# Patient Record
Sex: Female | Born: 1975 | Race: White | Hispanic: No | Marital: Single | Smoking: Never smoker
Health system: Southern US, Community
[De-identification: ages and names within clinical notes are randomized; demographics above are authoritative.]

## PROBLEM LIST (undated history)

## (undated) DIAGNOSIS — J329 Chronic sinusitis, unspecified: Secondary | ICD-10-CM

## (undated) DIAGNOSIS — J45909 Unspecified asthma, uncomplicated: Secondary | ICD-10-CM

## (undated) DIAGNOSIS — F32A Depression, unspecified: Secondary | ICD-10-CM

## (undated) DIAGNOSIS — F419 Anxiety disorder, unspecified: Secondary | ICD-10-CM

## (undated) DIAGNOSIS — J40 Bronchitis, not specified as acute or chronic: Secondary | ICD-10-CM

## (undated) DIAGNOSIS — G4700 Insomnia, unspecified: Secondary | ICD-10-CM

## (undated) DIAGNOSIS — K219 Gastro-esophageal reflux disease without esophagitis: Secondary | ICD-10-CM

## (undated) DIAGNOSIS — Z205 Contact with and (suspected) exposure to viral hepatitis: Secondary | ICD-10-CM

## (undated) DIAGNOSIS — F329 Major depressive disorder, single episode, unspecified: Secondary | ICD-10-CM

## (undated) HISTORY — DX: Depression, unspecified: F32.A

## (undated) HISTORY — DX: Bronchitis, not specified as acute or chronic: J40

## (undated) HISTORY — DX: Gastro-esophageal reflux disease without esophagitis: K21.9

## (undated) HISTORY — PX: KNEE ARTHROSCOPY: SUR90

## (undated) HISTORY — DX: Contact with and (suspected) exposure to viral hepatitis: Z20.5

## (undated) HISTORY — DX: Insomnia, unspecified: G47.00

## (undated) HISTORY — DX: Anxiety disorder, unspecified: F41.9

## (undated) HISTORY — DX: Major depressive disorder, single episode, unspecified: F32.9

## (undated) HISTORY — DX: Chronic sinusitis, unspecified: J32.9

## (undated) HISTORY — DX: Unspecified asthma, uncomplicated: J45.909

## (undated) HISTORY — PX: CARPAL TUNNEL RELEASE: SHX101

---
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
## 1995-07-12 HISTORY — PX: CHOLECYSTECTOMY: SHX55

## 2010-10-02 ENCOUNTER — Emergency Department (HOSPITAL_COMMUNITY): Payer: Medicaid Other

## 2010-10-02 ENCOUNTER — Emergency Department (HOSPITAL_COMMUNITY)
Admission: EM | Admit: 2010-10-02 | Discharge: 2010-10-02 | Disposition: A | Discharge status: Home / Self Care | Payer: Medicaid Other | Attending: Emergency Medicine | Admitting: Emergency Medicine

## 2010-10-02 DIAGNOSIS — Y92410 Unspecified street and highway as the place of occurrence of the external cause: Secondary | ICD-10-CM | POA: Insufficient documentation

## 2010-10-02 DIAGNOSIS — IMO0002 Reserved for concepts with insufficient information to code with codable children: Secondary | ICD-10-CM | POA: Insufficient documentation

## 2010-10-02 DIAGNOSIS — S82839A Other fracture of upper and lower end of unspecified fibula, initial encounter for closed fracture: Secondary | ICD-10-CM | POA: Insufficient documentation

## 2010-12-16 ENCOUNTER — Encounter: Payer: Self-pay | Admitting: Physician Assistant

## 2010-12-29 ENCOUNTER — Emergency Department (HOSPITAL_COMMUNITY): Payer: Medicaid Other

## 2010-12-29 ENCOUNTER — Emergency Department (HOSPITAL_COMMUNITY)
Admission: EM | Admit: 2010-12-29 | Discharge: 2010-12-29 | Disposition: A | Discharge status: Home / Self Care | Payer: Medicaid Other | Attending: Emergency Medicine | Admitting: Emergency Medicine

## 2010-12-29 DIAGNOSIS — X58XXXA Exposure to other specified factors, initial encounter: Secondary | ICD-10-CM | POA: Insufficient documentation

## 2010-12-29 DIAGNOSIS — IMO0002 Reserved for concepts with insufficient information to code with codable children: Secondary | ICD-10-CM | POA: Insufficient documentation

## 2010-12-29 LAB — D-DIMER, QUANTITATIVE: D-Dimer, Quant: 0.26 ug/mL-FEU (ref 0.00–0.48)

## 2013-10-08 ENCOUNTER — Ambulatory Visit (INDEPENDENT_AMBULATORY_CARE_PROVIDER_SITE_OTHER): Payer: Medicaid Other | Admitting: General Practice

## 2013-10-08 ENCOUNTER — Encounter (INDEPENDENT_AMBULATORY_CARE_PROVIDER_SITE_OTHER): Payer: Self-pay

## 2013-10-08 ENCOUNTER — Encounter: Payer: Self-pay | Admitting: General Practice

## 2013-10-08 VITALS — BP 129/77 | HR 59 | Temp 98.2°F | Ht 68.25 in | Wt 253.8 lb

## 2013-10-08 DIAGNOSIS — M25569 Pain in unspecified knee: Secondary | ICD-10-CM

## 2013-10-08 DIAGNOSIS — M25561 Pain in right knee: Secondary | ICD-10-CM

## 2013-10-08 NOTE — Progress Notes (Signed)
   Subjective:    Patient ID: Savannah Ortiz, female    DOB: [birth year], 38 y.o.   MRN: [id number]  HPI Patient presents today with complaints of right knee pain and referral request. She was seen at  [facility] and diagnosed with torn ligaments.     Review of Systems  Constitutional: Negative for fever and chills.  Respiratory: Negative for chest tightness and shortness of breath.   Cardiovascular: Negative for chest pain and palpitations.  Musculoskeletal:       Right knee pain        Objective:   Physical Exam  Constitutional: She is oriented to person, place, and time. She appears well-developed and well-nourished.  Cardiovascular: Normal rate, regular rhythm and normal heart sounds.   Pulmonary/Chest: Effort normal and breath sounds normal. No respiratory distress. She exhibits no tenderness.  Musculoskeletal: She exhibits tenderness.  Right knee pain and tenderness with palpation, flexion  And extension.   Neurological: She is alert and oriented to person, place, and time.  Skin: Skin is warm and dry.          Assessment & Plan:  1. Right knee pain - Ambulatory referral to Orthopedic Surgery -continue home care instructions -RTO prn Patient verbalized understanding Savannah KeensMae E. Efrata Brunner, FNP-C

## 2013-10-08 NOTE — Patient Instructions (Signed)
Knee Pain Knee pain can be a result of an injury or other medical conditions. Treatment will depend on the cause of your pain. HOME CARE  Only take medicine as told by your doctor.  Keep a healthy weight. Being overweight can make the knee hurt more.  Stretch before exercising or playing sports.  If there is constant knee pain, change the way you exercise. Ask your doctor for advice.  Make sure shoes fit well. Choose the right shoe for the sport or activity.  Protect your knees. Wear kneepads if needed.  Rest when you are tired. GET HELP RIGHT AWAY IF:   Your knee pain does not stop.  Your knee pain does not get better.  Your knee joint feels hot to the touch.  You have a fever. MAKE SURE YOU:   Understand these instructions.  Will watch this condition.  Will get help right away if you are not doing well or get worse. Document Released: 09/23/2008 Document Revised: 09/19/2011 Document Reviewed: 09/23/2008 ExitCare Patient Information 2014 [employer].  

## 2013-11-04 ENCOUNTER — Encounter: Payer: Self-pay | Admitting: General Practice

## 2013-11-04 ENCOUNTER — Ambulatory Visit (INDEPENDENT_AMBULATORY_CARE_PROVIDER_SITE_OTHER): Payer: Medicaid Other | Admitting: General Practice

## 2013-11-04 VITALS — BP 122/69 | HR 55 | Temp 97.1°F | Ht 68.25 in | Wt 260.8 lb

## 2013-11-04 DIAGNOSIS — Z205 Contact with and (suspected) exposure to viral hepatitis: Secondary | ICD-10-CM

## 2013-11-04 DIAGNOSIS — F411 Generalized anxiety disorder: Secondary | ICD-10-CM

## 2013-11-04 DIAGNOSIS — E663 Overweight: Secondary | ICD-10-CM

## 2013-11-04 DIAGNOSIS — Z20828 Contact with and (suspected) exposure to other viral communicable diseases: Secondary | ICD-10-CM

## 2013-11-04 NOTE — Patient Instructions (Signed)
Calorie Counting Diet A calorie counting diet requires you to eat the number of calories that are right for you in a day. Calories are the measurement of how much energy you get from the food you eat. Eating the right amount of calories is important for staying at a healthy weight. If you eat too many calories, your body will store them as fat and you may gain weight. If you eat too few calories, you may lose weight. Counting the number of calories you eat during a day will help you know if you are eating the right amount. A Registered Dietitian can determine how many calories you need in a day. The amount of calories needed varies from person to person. If your goal is to lose weight, you will need to eat fewer calories. Losing weight can benefit you if you are overweight or have health problems such as heart disease, high blood pressure, or diabetes. If your goal is to gain weight, you will need to eat more calories. Gaining weight may be necessary if you have a certain health problem that causes your body to need more energy. TIPS Whether you are increasing or decreasing the number of calories you eat during a day, it may be hard to get used to changes in what you eat and drink. The following are tips to help you keep track of the number of calories you eat.  Measure foods at home with measuring cups. This helps you know the amount of food and number of calories you are eating.  Restaurants often serve food in amounts that are larger than 1 serving. While eating out, estimate how many servings of a food you are given. For example, a serving of cooked rice is  cup or about the size of half of a fist. Knowing serving sizes will help you be aware of how much food you are eating at restaurants.  Ask for smaller portion sizes or child-size portions at restaurants.  Plan to eat half of a meal at a restaurant. Take the rest home or share the other half with a friend.  Read the Nutrition Facts panel on  food labels for calorie content and serving size. You can find out how many servings are in a package, the size of a serving, and the number of calories each serving has.  For example, a package might contain 3 cookies. The Nutrition Facts panel on that package says that 1 serving is 1 cookie. Below that, it will say there are 3 servings in the container. The calories section of the Nutrition Facts label says there are 90 calories. This means there are 90 calories in 1 cookie (1 serving). If you eat 1 cookie you have eaten 90 calories. If you eat all 3 cookies, you have eaten 270 calories (3 servings x 90 calories = 270 calories). The list below tells you how big or small some common portion sizes are.  1 oz.........4 stacked dice.  3 oz.........Deck of cards.  1 tsp........Tip of little finger.  1 tbs........Thumb.  2 tbs........Golf ball.   cup.......Half of a fist.  1 cup........A fist. KEEP A FOOD LOG Write down every food item you eat, the amount you eat, and the number of calories in each food you eat during the day. At the end of the day, you can add up the total number of calories you have eaten. It may help to keep a list like the one below. Find out the calorie information by reading the   Nutrition Facts panel on food labels. Breakfast  Bran cereal (1 cup, 110 calories).  Fat-free milk ( cup, 45 calories). Snack  Apple (1 medium, 80 calories). Lunch  Spinach (1 cup, 20 calories).  Tomato ( medium, 20 calories).  Chicken breast strips (3 oz, 165 calories).  Shredded cheddar cheese ( cup, 110 calories).  Light Italian dressing (2 tbs, 60 calories).  Whole-wheat bread (1 slice, 80 calories).  Tub margarine (1 tsp, 35 calories).  Vegetable soup (1 cup, 160 calories). Dinner  Pork chop (3 oz, 190 calories).  Brown rice (1 cup, 215 calories).  Steamed broccoli ( cup, 20 calories).  Strawberries (1  cup, 65 calories).  Whipped cream (1 tbs, 50  calories). Daily Calorie Total: 1425 Document Released: 06/27/2005 Document Revised: 09/19/2011 Document Reviewed: 12/22/2006 ExitCare Patient Information 2014 [employer].  

## 2013-11-04 NOTE — Progress Notes (Signed)
   Subjective:    Patient ID: Savannah Ortiz, female    DOB: 11/13/1975, 38 y.o.   MRN: [id number]  HPI Patient presents today to discuss weight loss, anxiety medications, and hep c testing. She reports being exposed to hep c by her husband. Patient also reports a history of taking ativan 0.5mg , 3 times daily for anxiety, but hasn't been on medication for 2-3 years. Wants to start a appetite suppressant for weight loss. Also reports being scheduled for right knee surgery in the near future.     Review of Systems  Constitutional: Negative for fever and chills.  Respiratory: Negative for chest tightness and shortness of breath.   Cardiovascular: Negative for chest pain and palpitations.  Musculoskeletal:       Right knee pain  Neurological: Negative for dizziness, weakness and headaches.  Psychiatric/Behavioral: Negative for suicidal ideas, sleep disturbance and self-injury. The patient is nervous/anxious.        Objective:   Physical Exam  Constitutional: She is oriented to person, place, and time. She appears well-developed and well-nourished.  Pulmonary/Chest: Effort normal and breath sounds normal. No respiratory distress. She exhibits no tenderness.  Musculoskeletal:  Limited range of motion right knee  Neurological: She is alert and oriented to person, place, and time.  Skin: Skin is warm and dry.  Psychiatric: She has a normal mood and affect.          Assessment & Plan:  1. Exposure to hepatitis C  - Hepatitis C antibody  2. Generalized anxiety disorder -discussed relaxation techniques -discussed other medications for anxiety (lexapro, paxil, prozac) -discussed counseling to help determine source of anxiety -patient declined suggested treatments for anxiety  3. Overweight -discussed making appointment with clinical pharmacist for healthy eating plan -discussed calorie counting Patient verbalized understanding Coralie KeensMae E. Taegen Delker, FNP-C

## 2013-11-05 LAB — HEPATITIS C ANTIBODY: Hep C Virus Ab: <0.1 s/co ratio (ref 0.0–0.9)

## 2013-11-08 ENCOUNTER — Telehealth: Payer: Self-pay | Admitting: General Practice

## 2013-11-08 NOTE — Telephone Encounter (Signed)
Attempted to contact patient, without succes and inform of lab results.

## 2013-11-21 ENCOUNTER — Telehealth: Payer: Self-pay | Admitting: General Practice

## 2013-11-21 NOTE — Telephone Encounter (Signed)
Please review labs. 

## 2013-11-21 NOTE — Telephone Encounter (Signed)
Hepatitis c panel negative

## 2013-11-22 NOTE — Telephone Encounter (Signed)
Discussed results She is going to schedule a follow up appt to discuss GERD and have additional labs.

## 2013-11-26 ENCOUNTER — Telehealth: Payer: Self-pay | Admitting: *Deleted

## 2013-11-26 ENCOUNTER — Ambulatory Visit: Payer: Medicaid Other

## 2013-11-26 NOTE — Telephone Encounter (Signed)
Normal labwork left on vm.

## 2014-03-18 ENCOUNTER — Telehealth: Payer: Self-pay | Admitting: Family Medicine

## 2014-03-27 NOTE — Telephone Encounter (Signed)
Unable to reach after multiple attempts 

## 2014-04-29 ENCOUNTER — Telehealth: Payer: Self-pay | Admitting: General Practice

## 2014-04-29 NOTE — Telephone Encounter (Signed)
Pt not had meds filled by us. Pt will ask [facility] to send request to us and will route to providers for review.

## 2014-04-29 NOTE — Telephone Encounter (Signed)
This needs a prior authorization

## 2014-04-30 NOTE — Telephone Encounter (Signed)
permethin if not for flea bites it is for scabies We have never seen patient for protonix

## 2014-05-07 ENCOUNTER — Encounter (INDEPENDENT_AMBULATORY_CARE_PROVIDER_SITE_OTHER): Payer: Self-pay

## 2014-05-07 ENCOUNTER — Telehealth: Payer: Self-pay | Admitting: Family Medicine

## 2014-05-07 ENCOUNTER — Ambulatory Visit (INDEPENDENT_AMBULATORY_CARE_PROVIDER_SITE_OTHER): Payer: Medicaid Other | Admitting: Family Medicine

## 2014-05-07 VITALS — BP 140/90 | HR 61 | Temp 97.9°F | Ht 68.25 in | Wt 256.6 lb

## 2014-05-07 DIAGNOSIS — R202 Paresthesia of skin: Secondary | ICD-10-CM

## 2014-05-07 DIAGNOSIS — M25561 Pain in right knee: Secondary | ICD-10-CM

## 2014-05-07 MED ORDER — IBUPROFEN 800 MG PO TABS: 800.0000 mg | ORAL_TABLET | Freq: Three times a day (TID) | ORAL | Status: DC | PRN

## 2014-05-07 MED ORDER — PANTOPRAZOLE SODIUM 40 MG PO TBEC: 40.0000 mg | DELAYED_RELEASE_TABLET | Freq: Every day | ORAL | Status: DC

## 2014-05-07 NOTE — Progress Notes (Signed)
   Subjective:    Patient ID: Savannah Ortiz, female    DOB: 01/15/1976, 38 y.o.   MRN: [id number]  HPI [age] year old female here to follow up with knee pain. She had surgery in June but prior to that had some injections for meniscal tear, by history. She thinks the orthopedic doctor calls some damage and now she has paresthesias and shooting pains emanating from her knee. She was previously on blood pressure medicine and says that she wants that refilled but as I explained to her her pressure today is upper limits of normal and I would not restart medicine based on that one measurement. I have asked her to check her blood pressure at home and return in 1 month with blood pressure readings.    Review of Systems  Constitutional: Negative.   HENT: Negative.   Cardiovascular: Negative.   Gastrointestinal: Negative.   Musculoskeletal: Positive for arthralgias.       R knee pain  Psychiatric/Behavioral: Negative.        Objective:   Physical Exam  Constitutional:  obese  Cardiovascular: Normal rate.   Pulmonary/Chest: Effort normal and breath sounds normal.  Musculoskeletal:  Would not permit adequate exam due to pain  Neurological: She is alert.  Reflexes not checked as tapping with reflex hammer produced pain   BP 140/90  Pulse 61  Temp(Src) 97.9 F (36.6 C) (Oral)  Ht 5' 8.25" (1.734 m)  Wt 256 lb 9.6 oz (116.393 kg)  BMI 38.71 kg/m2       Assessment & Plan:  1. Knee joint pain, right Referred to neuro because other ortho referral would be not likely to deal with another ortho's problem - Ambulatory referral to Neurology  2. Paresthesia of right leg Doubt that injections or surgery really is causative agent - Ambulatory referral to Neurology  Savannah KusterStephen M Nalany Steedley MD

## 2014-05-07 NOTE — Telephone Encounter (Signed)
Lm, Protonix and ibuprofen at pharmacy although patient said she was told they were not.    No ativan was ordered by provider. Patient is requesting ativan refill. Please advise.

## 2014-05-08 ENCOUNTER — Telehealth: Payer: Self-pay | Admitting: Family Medicine

## 2014-05-08 ENCOUNTER — Other Ambulatory Visit: Payer: Self-pay | Admitting: Family Medicine

## 2014-05-08 MED ORDER — LORAZEPAM 0.5 MG PO TABS: 0.5000 mg | ORAL_TABLET | Freq: Two times a day (BID) | ORAL | Status: DC | PRN

## 2014-05-08 NOTE — Telephone Encounter (Signed)
Med was called in

## 2014-05-08 NOTE — Telephone Encounter (Signed)
Nurse says rx was called in to [facility] in [place].

## 2014-06-23 ENCOUNTER — Ambulatory Visit (INDEPENDENT_AMBULATORY_CARE_PROVIDER_SITE_OTHER): Payer: Medicaid Other | Admitting: Neurology

## 2014-06-23 ENCOUNTER — Encounter: Payer: Self-pay | Admitting: Neurology

## 2014-06-23 VITALS — BP 118/70 | HR 53 | Ht 69.0 in | Wt 257.3 lb

## 2014-06-23 DIAGNOSIS — R202 Paresthesia of skin: Secondary | ICD-10-CM

## 2014-06-23 DIAGNOSIS — M79604 Pain in right leg: Secondary | ICD-10-CM

## 2014-06-23 DIAGNOSIS — M545 Low back pain: Secondary | ICD-10-CM

## 2014-06-23 NOTE — Patient Instructions (Addendum)
1.  MRI lumbar spine wo contrast 2.  Consider EMG going forward, pending MRI results 3.  Return to clinic in 6 weeks

## 2014-06-23 NOTE — Progress Notes (Signed)
Savannah Ortiz - Initial Visit   Date: 06/23/2014  Savannah Ortiz MRN: [id number] DOB: 25-Sep-1975   Dear Dr. Hyacinth MeekerMiller:  Thank you for your kind referral of Savannah Ortiz for consultation of right knee pain and paresthesias. Although her history is well known to you, please allow us to reiterate it for the purpose of our medical record. The patient was accompanied to the clinic by self.   History of Present Illness: Savannah Ortiz is a 38 y.o. right-handed Caucasian female with depression, anxiety, GERD, and hepatitis C exposure (antibody negative) presenting for evaluation of right knee pain and paresthesias.  Patient underwent right knee surgery for meniscal tear in June 2015.  In October, she received an injection ("orthovist") to her right knee because of persistent pain and swelling.  After her second injection, the following day she developed soreness of her knee and tingling of the toes with radiation into her back.  She reports having generalized right leg pain involving a circumferential pattern.  She feels that it is positional, but has not noticed which position makes it worse.  Even at rest, she has dull pain of her legs.  No true weakness or falls, but has a limp because of her knee pain.   She reports having problems with swallowing for the past 2-3 months, especially with solids.  She is unable to swallow pills well.  Denies dysarthria, double vision, or limb weakness.    Out-side paper records, electronic medical record, and images have been reviewed where available and summarized as:  XR right tibia/fibula 10/01/2013:  No abnormalities  Past Medical History  Diagnosis Date  . Insomnia   . Anxiety   . Allergic rhinitis   . Reactive airway disease   . Depression   . Bronchitis   . Sinusitis   . Hepatitis C     Exposure (Husband is positive)    Past Surgical History  Procedure Laterality Date  . Reconstructive for  feet and leg  1985  . Cholecystectomy  1997  . Carpal tunnel release  2010 & 2011     Medications:  Current Outpatient Prescriptions on File Prior to Visit  Medication Sig Dispense Refill  . ibuprofen (ADVIL,MOTRIN) 800 MG tablet Take 1 tablet (800 mg total) by mouth every 8 (eight) hours as needed. 90 tablet 1  . LORazepam (ATIVAN) 0.5 MG tablet Take 1 tablet (0.5 mg total) by mouth 2 (two) times daily as needed. 60 tablet 0  . pantoprazole (PROTONIX) 40 MG tablet Take 1 tablet (40 mg total) by mouth daily. 30 tablet 1   No current facility-administered medications on file prior to visit.    Allergies:  Allergies  Allergen Reactions  . Codeine     Family History: Family History  Problem Relation Age of Onset  . Diabetes Maternal Grandmother   . Ovarian cancer Mother   . Thyroid cancer Father   . Cancer Sister   . Cancer Maternal Grandmother   . Cancer Paternal Grandfather   . Healthy Daughter   . Healthy Son     x 3    Social History: History   Social History  . Marital Status: Legally Separated    Spouse Name: N/A    Number of Children: N/A  . Years of Education: N/A   Occupational History  . Not on file.   Social History Main Topics  . Smoking status: Never Smoker   . Smokeless tobacco: Not on file  .  Alcohol Use: No  . Drug Use: No  . Sexual Activity: Not on file   Other Topics Concern  . Not on file   Social History Narrative   Lives with husband and 3 sons in a one story home.     Works in a Programme researcher, broadcasting/film/video.     Education level: some college.      Review of Systems:  CONSTITUTIONAL: No fevers, chills, night sweats, or weight loss.   EYES: No visual changes or eye pain ENT: No hearing changes.  No history of nose bleeds.   RESPIRATORY: No cough, wheezing and shortness of breath.   CARDIOVASCULAR: Negative for chest pain, and palpitations.   GI: Negative for abdominal discomfort, blood in stools or black stools.  No recent change in bowel  habits.   GU:  No history of incontinence.   MUSCLOSKELETAL: +history of joint pain or swelling.  No myalgias.   SKIN: Negative for lesions, rash, and itching.   HEMATOLOGY/ONCOLOGY: Negative for prolonged bleeding, bruising easily, and swollen nodes.  No history of cancer.   ENDOCRINE: Negative for cold or heat intolerance, polydipsia or goiter.   PSYCH:  +depression or anxiety symptoms.   NEURO: As Above.   Vital Signs:  BP 118/70 mmHg  Pulse 53  Ht 5\' 9"  (1.753 m)  Wt 257 lb 5 oz (116.716 kg)  BMI 37.98 kg/m2  SpO2 99%  General Medical Exam:   General:  Obese, comfortable.   Eyes/ENT: see cranial nerve examination.   Neck: No masses appreciated.  Full range of motion without tenderness.  No carotid bruits. Respiratory:  Clear to auscultation, good air entry bilaterally.   Cardiac:  Regular rate and rhythm, no murmur.   Extremities:  No deformities, edema, or skin discoloration.  Skin:  No rashes or lesions.  Neurological Exam: MENTAL STATUS including orientation to time, place, person, recent and remote memory, attention span and concentration, language, and fund of knowledge is normal.  Speech is not dysarthric.  Lingual and gutteral sounds are normal.  CRANIAL NERVES: II:  No visual field defects.  Unremarkable fundi.   III-IV-VI: Pupils equal round and reactive to light.  Normal conjugate, extra-ocular eye movements in all directions of gaze.  No nystagmus.  No ptosis.   V:  Normal facial sensation.  VII:  Normal facial symmetry and movements. Facial muscles are full and intact  VIII:  Normal hearing and vestibular function.   IX-X:  Normal palatal movement.  Poor dentition. XI:  Normal shoulder shrug and head rotation.   XII:  Normal tongue strength and range of motion, no deviation or fasciculation.  MOTOR:  No atrophy, fasciculations or abnormal movements.  No pronator drift.  Tone is normal.    Right Upper Extremity:    Left Upper Extremity:    Deltoid  5/5    Deltoid  5/5   Biceps  5/5   Biceps  5/5   Triceps  5/5   Triceps  5/5   Wrist extensors  5/5   Wrist extensors  5/5   Wrist flexors  5/5   Wrist flexors  5/5   Finger extensors  5/5   Finger extensors  5/5   Finger flexors  5/5   Finger flexors  5/5   Dorsal interossei  5/5   Dorsal interossei  5/5   Abductor pollicis  5/5   Abductor pollicis  5/5   Tone (Ashworth scale)  0  Tone (Ashworth scale)  0   Right Lower Extremity:  Left Lower Extremity:    Hip flexors* inconsistent  5/5   Hip flexors  5/5   Hip extensors  5/5   Hip extensors  5/5   Knee flexors  5/5   Knee flexors  5/5   Knee extensors  5/5   Knee extensors  5/5   Dorsiflexors  5/5   Dorsiflexors  5/5   Plantarflexors  5/5   Plantarflexors  5/5   Toe extensors  5/5   Toe extensors  5/5   Toe flexors  5/5   Toe flexors  5/5   Tone (Ashworth scale)  0  Tone (Ashworth scale)  0   MSRs:  Right                                                                 Left brachioradialis 2+  brachioradialis 2+  biceps 2+  biceps 2+  triceps 2+  triceps 2+  patellar 2+  patellar 2+  ankle jerk 1+  ankle jerk 1+  Hoffman no  Hoffman no  plantar response down  plantar response down   SENSORY:  Reduced pin prick, temperature, and vibration over the right leg (circumfrentially)  Romberg's sign absent.   COORDINATION/GAIT: Normal finger-to- nose-finger.  Able to rise from a chair without using arms.  Gait wide-based, antalgic, and slow.  Tandem intact, she is able to stand on heels and toes, but pain limits her ability to walk.   IMPRESSION: Ms. Wilson SingerHudson is a 38 year-old female presenting for evaluation of right leg pain, tingling, and numbness.  Her exam shows normal motor strength and reflexes.  She reports to having sensory changes over the entire right leg, proximally and distally.  These changes do not fit a dermatomal or cutaneous nerve distribution.  With her associated low back pain, multilevel foraminal or canal stenosis is  possible, so will first proceed with MRI lumbar spine.  I explained that neurological complication from her knee injections is unlikely to explain her current symptomology based on the generalized nature of her paresthesias, but moreso, she has more proximal complaints which cannot be explained by a distal injection.  She also complains about dysphagia to solids only.  Given normal exam and no fluctuating nature to her symptoms, doubt this is myasthenia, but for completeness, will obtain AChR antibody testing.  She may need GI evaluation if symptoms persist.   PLAN/RECOMMENDATIONS:  MRI lumbar spine wo contrast Consider EMG of the right leg going forward, technical limitations due to body habitus will need to be kept in mind when interpreting these results Check Acetylcholine receptor antibody panel Return to clinic in 6-weeks   The duration of this appointment visit was 45 minutes of face-to-face time with the patient.  Greater than 50% of this time was spent in counseling, explanation of diagnosis, planning of further management, and coordination of care.   Thank you for allowing me to participate in patient's care.  If I can answer any additional questions, I would be pleased to do so.    Sincerely,    Donika K. Allena KatzPatel, DO

## 2014-06-25 ENCOUNTER — Telehealth: Payer: Self-pay | Admitting: *Deleted

## 2014-06-25 NOTE — Telephone Encounter (Signed)
Patient notified MRI is on December 16 at 11:45 at [facility].

## 2014-06-28 LAB — MYASTHENIA GRAVIS PANEL 2
Acetylcholine Rec Binding: <0.3 nmol/L
Aceytlcholine Rec Bloc Ab: <15 % of inhibition (ref <15)

## 2014-06-30 ENCOUNTER — Telehealth: Payer: Self-pay | Admitting: *Deleted

## 2014-06-30 MED ORDER — CYCLOBENZAPRINE HCL 5 MG PO TABS: 5.0000 mg | ORAL_TABLET | Freq: Every day | ORAL | Status: DC

## 2014-06-30 MED ORDER — GABAPENTIN 300 MG PO CAPS: ORAL_CAPSULE | ORAL | Status: DC

## 2014-06-30 NOTE — Addendum Note (Signed)
Addended by: Glendale ChardPATEL, Jackee Glasner K on: 06/30/2014 03:54 PM   Modules accepted: Orders

## 2014-06-30 NOTE — Telephone Encounter (Signed)
MRI lumbar spine without contrast from [facility] received (06/25/2014) 1. L3-L4 right foraminal shallow disc protrusion. The right neural foramen remains patent but the protrusion just contacts the exiting right L3 nerve and descending right L4 nerve. 2. L4-L5 shallow disc bulging and disc desiccation without stenosis. 3. Partially visible cystic lesion in the right anatomic pelvis measuring at least 46 mm but possibly knowledge appeared follow-up six-week pelvic ultrasound recommended  The above results were discussed with the patient. Physical therapy was recommended, however she reports her insurance will not cover. In the meantime, we will start gabapentin 300 mg at bedtime and uptitrate as tolerable. She can also start Flexeril 5 mg at bedtime. Further, for her cystic pelvic lesion. I have asked her to follow-up with her OB/GYN for ultrasound.  Savannah Ortiz K. Allena KatzPatel, DO

## 2014-06-30 NOTE — Telephone Encounter (Signed)
Please advise 

## 2014-06-30 NOTE — Telephone Encounter (Signed)
She had it done at [facility].  Results are on your desk.

## 2014-06-30 NOTE — Telephone Encounter (Signed)
Where did she have it done?  I don't have any results. You may need to call the imaging center where she had it performed.  Pernell Lenoir K. Allena KatzPatel, DO

## 2014-06-30 NOTE — Telephone Encounter (Signed)
Patient calling for Mri results [phone]

## 2014-07-07 ENCOUNTER — Telehealth: Payer: Self-pay | Admitting: Family Medicine

## 2014-07-07 ENCOUNTER — Telehealth: Payer: Self-pay | Admitting: Neurology

## 2014-07-07 ENCOUNTER — Telehealth: Payer: Self-pay | Admitting: *Deleted

## 2014-07-07 NOTE — Telephone Encounter (Signed)
Patient called stating that the gabapentin is not making her sleepy and it is helping some.  She said that she is still having some pain though.  Informed her that I would find out if we need to increase dosage.  She is currently on 300 mg bid.

## 2014-07-07 NOTE — Telephone Encounter (Signed)
MRI faxed to Dr. Jacalyn LefevreStephen Miller.

## 2014-07-07 NOTE — Telephone Encounter (Signed)
Pt would like to talk to someone about the results of the MRI and medication she is still having some pain and discomfort please call [phone]

## 2014-07-07 NOTE — Telephone Encounter (Signed)
Patient seen Dr. Allena KatzPatel and he had a MRI ordered and they saw a cystic lesion measuring 46mm and recommended that she follow up with a OBGYN and wants to know if we can refer her to GYN in [place]. Patient was also told that we would need a copy of MRI results.

## 2014-07-07 NOTE — Telephone Encounter (Signed)
Patient would like a copy of her MRI faxed to her PCP. She had [facility] done [facility] about two weeks ago Call back number [phone]

## 2014-07-08 ENCOUNTER — Encounter: Payer: Self-pay | Admitting: Neurology

## 2014-07-09 NOTE — Telephone Encounter (Signed)
Please advise 

## 2014-07-09 NOTE — Telephone Encounter (Signed)
Refer to GYN for cyst

## 2014-07-09 NOTE — Telephone Encounter (Signed)
OK to increase to gabapentin 600mg  twice daily - start by taking 600mg  (2 tab) at bedtime x 3 days, if she is not too sleepy, then increase to 600mg  (2 tab) twice daily.    Kenni Newton K. Allena KatzPatel, DO

## 2014-07-09 NOTE — Telephone Encounter (Signed)
Patient given instructions

## 2014-07-18 ENCOUNTER — Telehealth: Payer: Self-pay | Admitting: Family Medicine

## 2014-07-18 NOTE — Telephone Encounter (Signed)
Patient says she has an extreme amount of stress at this time. She is aware that this will be addressed during providers next work day.

## 2014-07-22 ENCOUNTER — Telehealth: Payer: Self-pay | Admitting: Family Medicine

## 2014-07-22 ENCOUNTER — Other Ambulatory Visit: Payer: Self-pay

## 2014-07-22 DIAGNOSIS — N838 Other noninflammatory disorders of ovary, fallopian tube and broad ligament: Secondary | ICD-10-CM

## 2014-07-22 MED ORDER — LORAZEPAM 1 MG PO TABS: 1.0000 mg | ORAL_TABLET | Freq: Two times a day (BID) | ORAL | Status: DC | PRN

## 2014-07-22 MED ORDER — LORAZEPAM 0.5 MG PO TABS: 1.0000 mg | ORAL_TABLET | Freq: Two times a day (BID) | ORAL | Status: DC

## 2014-07-22 NOTE — Telephone Encounter (Signed)
Ativan 1 mg , po twice daily prn for anxiety, qty 60, 0 refill called to [facility], [place].

## 2014-07-22 NOTE — Telephone Encounter (Signed)
Duplicate

## 2014-07-22 NOTE — Telephone Encounter (Signed)
Duplicate message. 

## 2014-07-22 NOTE — Addendum Note (Signed)
Addended by: Almeta MonasSTONE, Loren Sawaya M on: 07/22/2014 03:33 PM   Modules accepted: Orders

## 2014-07-25 ENCOUNTER — Telehealth: Payer: Self-pay | Admitting: Neurology

## 2014-07-25 MED ORDER — GABAPENTIN 300 MG PO CAPS: 600.0000 mg | ORAL_CAPSULE | Freq: Three times a day (TID) | ORAL | Status: DC

## 2014-07-25 MED ORDER — CYCLOBENZAPRINE HCL 5 MG PO TABS: 5.0000 mg | ORAL_TABLET | Freq: Two times a day (BID) | ORAL | Status: DC | PRN

## 2014-07-25 NOTE — Telephone Encounter (Signed)
Pt states that she is still in a lot of pain and needs someone to call her about medication [phone]

## 2014-07-25 NOTE — Telephone Encounter (Signed)
Increase gabapentin to 600mg  TID and start flexeril 5mg  BID.  Recommended PT, but patient says its not covered by her insurance.  If no improvement, consider epidural steroid injection and referral to Pain management.  Donika K. Allena KatzPatel, DO

## 2014-07-25 NOTE — Telephone Encounter (Signed)
We have increased her gabapentin to 600 mg bid.  Any other suggestions?  Pain clinic?

## 2014-08-26 ENCOUNTER — Other Ambulatory Visit: Payer: Self-pay | Admitting: Family Medicine

## 2014-08-27 ENCOUNTER — Other Ambulatory Visit: Payer: Self-pay | Admitting: *Deleted

## 2014-08-27 NOTE — Telephone Encounter (Signed)
Last filled 07/22/14, last seen 05/07/14. [facility] [place] [facility]

## 2014-08-28 MED ORDER — LORAZEPAM 1 MG PO TABS: 1.0000 mg | ORAL_TABLET | Freq: Two times a day (BID) | ORAL | Status: DC | PRN

## 2014-08-28 NOTE — Telephone Encounter (Signed)
Script for ativan called to [facility].

## 2014-09-23 ENCOUNTER — Telehealth: Payer: Self-pay | Admitting: Neurology

## 2014-09-23 NOTE — Telephone Encounter (Signed)
Pt called wanting to speak to a nurse regarding the scripts for GABAPENTIN 300 MG AND CYCLOBENZARINE 5 MG C/b [phone]

## 2014-09-23 NOTE — Telephone Encounter (Signed)
Called patient back and left message for her to call me back.  

## 2014-09-25 NOTE — Telephone Encounter (Signed)
We can try nortriptyline 10 mg at bedtime for 2 weeks, then increase to 2 tablets at bedtime. If patient is agreeable, please send a prescription. If this does not help, she may need to see pain management for epidural steroid injection.   Ryla Cauthon K. Allena KatzPatel, DO

## 2014-09-25 NOTE — Telephone Encounter (Signed)
Called patient again and left message for her to call me back.   

## 2014-09-25 NOTE — Telephone Encounter (Signed)
Patient can't take flexeril any more because it makes her too sleepy.  She is on Gabapentin 900 mg tid but is still having pain.  Is there anything else we can add?

## 2014-09-26 ENCOUNTER — Other Ambulatory Visit: Payer: Self-pay | Admitting: *Deleted

## 2014-09-26 MED ORDER — NORTRIPTYLINE HCL 10 MG PO CAPS: 10.0000 mg | ORAL_CAPSULE | Freq: Every day | ORAL | Status: DC

## 2014-09-26 NOTE — Telephone Encounter (Signed)
Patient agreed to try nortriptyline.   Rx sent in.

## 2014-10-20 ENCOUNTER — Other Ambulatory Visit: Payer: Self-pay | Admitting: Family Medicine

## 2014-10-20 MED ORDER — LORAZEPAM 1 MG PO TABS: 1.0000 mg | ORAL_TABLET | Freq: Two times a day (BID) | ORAL | Status: DC | PRN

## 2014-10-21 NOTE — Telephone Encounter (Signed)
rx called into pharmacy

## 2014-11-07 ENCOUNTER — Ambulatory Visit (INDEPENDENT_AMBULATORY_CARE_PROVIDER_SITE_OTHER): Payer: Medicaid Other | Admitting: Physician Assistant

## 2014-11-07 ENCOUNTER — Encounter: Payer: Self-pay | Admitting: Physician Assistant

## 2014-11-07 VITALS — BP 139/82 | HR 69 | Temp 98.6°F | Ht 69.0 in | Wt 241.0 lb

## 2014-11-07 DIAGNOSIS — H6982 Other specified disorders of Eustachian tube, left ear: Secondary | ICD-10-CM

## 2014-11-07 DIAGNOSIS — K219 Gastro-esophageal reflux disease without esophagitis: Secondary | ICD-10-CM | POA: Diagnosis not present

## 2014-11-07 DIAGNOSIS — J020 Streptococcal pharyngitis: Secondary | ICD-10-CM

## 2014-11-07 DIAGNOSIS — M5126 Other intervertebral disc displacement, lumbar region: Secondary | ICD-10-CM

## 2014-11-07 DIAGNOSIS — F411 Generalized anxiety disorder: Secondary | ICD-10-CM

## 2014-11-07 DIAGNOSIS — R1310 Dysphagia, unspecified: Secondary | ICD-10-CM

## 2014-11-07 DIAGNOSIS — J3089 Other allergic rhinitis: Secondary | ICD-10-CM | POA: Diagnosis not present

## 2014-11-07 LAB — POCT RAPID STREP A (OFFICE): RAPID STREP A SCREEN: NEGATIVE

## 2014-11-07 MED ORDER — AMOXICILLIN 875 MG PO TABS: 875.0000 mg | ORAL_TABLET | Freq: Two times a day (BID) | ORAL | Status: DC

## 2014-11-07 MED ORDER — FLUTICASONE PROPIONATE 50 MCG/ACT NA SUSP: 2.0000 | Freq: Every day | NASAL | Status: DC

## 2014-11-07 MED ORDER — LORAZEPAM 1 MG PO TABS: 1.0000 mg | ORAL_TABLET | Freq: Two times a day (BID) | ORAL | Status: DC | PRN

## 2014-11-07 MED ORDER — CETIRIZINE HCL 10 MG PO TABS: 10.0000 mg | ORAL_TABLET | Freq: Every day | ORAL | Status: DC

## 2014-11-07 MED ORDER — IBUPROFEN 800 MG PO TABS: 800.0000 mg | ORAL_TABLET | Freq: Three times a day (TID) | ORAL | Status: DC | PRN

## 2014-11-07 NOTE — Progress Notes (Signed)
   Subjective:    Patient ID: Savannah Ortiz, female    DOB: [birth year], 39 y.o.   MRN: [id number]  HPI Patient presents for medication refill today. However, she is having increased symptoms with her GERD. She states that she has pain in her throat area with drinking water and "feels the food going all the way down her chest". Increased choking episodes.   C/o Runny nose and left ear pain  X 1 week    Review of Systems  HENT: Positive for congestion (nasal ), ear pain (left ), rhinorrhea and trouble swallowing. Negative for ear discharge, postnasal drip, sinus pressure, sneezing and sore throat.   Respiratory: Negative for cough and shortness of breath.   Gastrointestinal: Positive for nausea (after eating ). Negative for abdominal pain, diarrhea and constipation.  All other systems reviewed and are negative.      Objective:   Physical Exam  Constitutional: She appears well-developed and well-nourished. No distress.  HENT:  Head: Normocephalic.  Right Ear: External ear normal.  Left Ear: External ear normal.  Mouth/Throat: Oropharynx is clear and moist. No oropharyngeal exudate.  Boggy nasal turbinates bilaterally  TTP bilateral adenopathy submandibular and eustachian tube region on left side of neck   Mild injection in posterior pharynx    Neck: Normal range of motion. No thyromegaly present.  Cardiovascular: Normal rate, regular rhythm, normal heart sounds and intact distal pulses.  Exam reveals no gallop and no friction rub.   No murmur heard. Pulmonary/Chest: Effort normal and breath sounds normal. No respiratory distress. She has no wheezes.  Musculoskeletal: Normal range of motion.  Skin: She is not diaphoretic.  Psychiatric: She has a normal mood and affect. Her behavior is normal. Judgment and thought content normal.  Nursing note and vitals reviewed.         Assessment & Plan:  1. Streptococcal sore throat  - POCT rapid strep A - Culture, Group A  Strep  2. Lumbar disc herniation   3. GAD (generalized anxiety disorder)  - LORazepam (ATIVAN) 1 MG tablet; Take 1 tablet (1 mg total) by mouth 2 (two) times daily as needed for anxiety.  Dispense: 60 tablet; Refill: 2  4. Gastroesophageal reflux disease without esophagitis  - Ambulatory referral to Gastroenterology  5. Difficulty swallowing  - Ambulatory referral to Gastroenterology  6. Other allergic rhinitis  - cetirizine (ZYRTEC) 10 MG tablet; Take 1 tablet (10 mg total) by mouth daily.  Dispense: 30 tablet; Refill: 11 - fluticasone (FLONASE) 50 MCG/ACT nasal spray; Place 2 sprays into both nostrils daily.  Dispense: 16 g; Refill: 6  7. Eustachian tube dysfunction, left  - amoxicillin (AMOXIL) 875 MG tablet; Take 1 tablet (875 mg total) by mouth 2 (two) times daily.  Dispense: 20 tablet; Refill: 0   Continue all meds Labs pending Health Maintenance reviewed Diet and exercise encouraged RTO prn   Joelys Staubs A. Chauncey ReadingGann PA-C

## 2014-11-07 NOTE — Addendum Note (Signed)
Addended by: Margurite AuerbachOMPTON, Nella Botsford G on: 11/07/2014 10:19 AM   Modules accepted: Orders

## 2014-11-10 ENCOUNTER — Telehealth: Payer: Self-pay | Admitting: *Deleted

## 2014-11-10 LAB — CULTURE, GROUP A STREP: Strep A Culture: NEGATIVE

## 2014-11-10 NOTE — Telephone Encounter (Signed)
Please advise 

## 2014-11-10 NOTE — Telephone Encounter (Signed)
Call about her back pain shes taking her medication  gabapentin and flexeril and one more is not working  Call back number [phone]

## 2014-11-10 NOTE — Telephone Encounter (Signed)
Let's send her to Dr. Murray HodgkinsBartko with [facility] for epidural spinal injections.  Donika K. Allena KatzPatel, DO

## 2014-11-10 NOTE — Telephone Encounter (Signed)
Called patient back and left message for her to call me back.  

## 2014-11-12 ENCOUNTER — Encounter: Payer: Self-pay | Admitting: Internal Medicine

## 2014-11-12 NOTE — Telephone Encounter (Signed)
Patient informed of plan.  Referral faxed to Dr. Murray HodgkinsBartko.

## 2014-11-14 ENCOUNTER — Telehealth: Payer: Self-pay | Admitting: Neurology

## 2014-11-14 NOTE — Telephone Encounter (Signed)
Patient notified that Dr. Murray HodgkinsBartko has the referral and is reviewing it.  She should hear from them soon.

## 2014-11-14 NOTE — Telephone Encounter (Signed)
Has been referred to another neurologist for shots in back, she hasn't heard from them to schedule. Her phone may be d/c'ed in a few days so she is trying to line appt up now. Please call [phone] / Sherri S.

## 2014-11-19 ENCOUNTER — Telehealth: Payer: Self-pay | Admitting: *Deleted

## 2014-11-19 ENCOUNTER — Telehealth: Payer: Self-pay | Admitting: Family Medicine

## 2014-11-19 NOTE — Telephone Encounter (Signed)
Patient called stating that she is in terrible pain and needs to have something for the pain.  Informed her that we do not prescribe narcotic pain medications.  She is waiting for appointment with neurosurgery for back injections.  I called [facility] and spoke with Marylene LandAngela.  She said that the doctor is still reviewing the notes.   I called patient back to let her know and instructed her to call her PCP if she needs something else for pain or go to the ER if the pain gets too bad.  Patient agreed with plan.

## 2014-11-19 NOTE — Telephone Encounter (Signed)
I spoke with Dr. Karel JarvisAquino and she said that we can try nortriptyline at 30 mg x 2 weeks then increase to 40 mg.  Patient agreed to try even though she said that it makes her sleepy.

## 2014-12-03 ENCOUNTER — Telehealth: Payer: Self-pay | Admitting: Gastroenterology

## 2014-12-03 ENCOUNTER — Ambulatory Visit: Payer: Medicaid Other | Admitting: Gastroenterology

## 2014-12-03 NOTE — Telephone Encounter (Signed)
Pt was a no show

## 2014-12-05 ENCOUNTER — Other Ambulatory Visit: Payer: Self-pay

## 2014-12-05 ENCOUNTER — Ambulatory Visit (INDEPENDENT_AMBULATORY_CARE_PROVIDER_SITE_OTHER): Payer: Medicaid Other | Admitting: Gastroenterology

## 2014-12-05 ENCOUNTER — Ambulatory Visit (HOSPITAL_COMMUNITY)
Admission: RE | Admit: 2014-12-05 | Discharge: 2014-12-05 | Disposition: A | Discharge status: Home / Self Care | Payer: Medicaid Other | Source: Ambulatory Visit | Attending: Gastroenterology | Admitting: Gastroenterology

## 2014-12-05 ENCOUNTER — Other Ambulatory Visit (HOSPITAL_COMMUNITY)
Admission: RE | Admit: 2014-12-05 | Discharge: 2014-12-05 | Disposition: A | Discharge status: Home / Self Care | Payer: Medicaid Other | Source: Ambulatory Visit | Attending: Gastroenterology | Admitting: Gastroenterology

## 2014-12-05 ENCOUNTER — Encounter: Payer: Self-pay | Admitting: Gastroenterology

## 2014-12-05 VITALS — BP 132/84 | HR 69 | Temp 98.8°F | Ht 68.0 in | Wt 237.4 lb

## 2014-12-05 DIAGNOSIS — R112 Nausea with vomiting, unspecified: Secondary | ICD-10-CM | POA: Insufficient documentation

## 2014-12-05 DIAGNOSIS — R1314 Dysphagia, pharyngoesophageal phase: Secondary | ICD-10-CM

## 2014-12-05 DIAGNOSIS — R634 Abnormal weight loss: Secondary | ICD-10-CM | POA: Diagnosis not present

## 2014-12-05 DIAGNOSIS — R109 Unspecified abdominal pain: Secondary | ICD-10-CM | POA: Insufficient documentation

## 2014-12-05 DIAGNOSIS — R1013 Epigastric pain: Secondary | ICD-10-CM

## 2014-12-05 LAB — HCG, SERUM, QUALITATIVE: Preg, Serum: NEGATIVE

## 2014-12-05 MED ORDER — IOHEXOL 300 MG/ML  SOLN
100.0000 mL | Freq: Once | INTRAMUSCULAR | Status: AC | PRN
Start: 2014-12-05 — End: 2014-12-05
  Administered 2014-12-05: 100 mL via INTRAVENOUS

## 2014-12-05 MED ORDER — SUCRALFATE 1 GM/10ML PO SUSP: 1.0000 g | Freq: Four times a day (QID) | ORAL | Status: DC

## 2014-12-05 NOTE — Patient Instructions (Signed)
I have sent in carafate to your pharmacy to take 4 times a day.   Please have the CT done today. They will call me with results and I will let you know if there are any changes to our plan. Otherwise, we have scheduled you for an upper endoscopy with dilation with Dr. Darrick PennaFields next week.

## 2014-12-05 NOTE — Progress Notes (Signed)
Primary Care Physician:  Lynwood Dawley, PA-C Primary Gastroenterologist:  Dr. Darrick Penna   Chief Complaint  Patient presents with  . Gastrophageal Reflux    can't keep anything down / dysphagia    HPI:   Savannah Ortiz is a 39 y.o. female presenting today at the request of Lynwood Dawley, [place], secondary to severe GERD and dysphagia for the past several months.      Water makes insides "burn" very bad. Epigastric region to mid-chest. Feels like something lodges then "pushes through". Will throw up for hours at a time daily. States she has lost 60 lbs in the last few months. States was 280-290 now in 230s. Has had to blend food up to get relief. Eats Tums like it is skittles. Had 3 bites of a biscuit this morning and feels it already rising back up. Everything makes her sick. Esophageal dysphagia. Blending food now and doesn't even help.  Zantac without improvement. On Protonix once a day now, not taking. Has tried Prilosec, Nexium. More on diarrhea side lately.    Past Medical History  Diagnosis Date  . Insomnia   . Anxiety   . Allergic rhinitis   . Reactive airway disease   . Depression   . Bronchitis   . Sinusitis   . Exposure to hepatitis C     Exposure (Husband is positive)  . GERD (gastroesophageal reflux disease)     Past Surgical History  Procedure Laterality Date  . Reconstructive for feet and leg  1985  . Cholecystectomy  1997  . Carpal tunnel release  2010 & 2011  . Knee arthroscopy      Current Outpatient Prescriptions  Medication Sig Dispense Refill  . MIRENA 20 MCG/24HR IUD   0  . sucralfate (CARAFATE) 1 GM/10ML suspension Take 10 mLs (1 g total) by mouth 4 (four) times daily. 420 mL 1   No current facility-administered medications for this visit.    Allergies as of 12/05/2014 - Review Complete 12/05/2014  Allergen Reaction Noted  . Codeine  12/16/2010    Family History  Problem Relation Age of Onset  . Diabetes Maternal Grandmother   . Ovarian  cancer Mother   . Thyroid cancer Father   . Cancer Sister   . Cancer Maternal Grandmother   . Cancer Paternal Grandfather   . Healthy Daughter   . Healthy Son     x 3  . Colon cancer Neg Hx     History   Social History  . Marital Status: Legally Separated    Spouse Name: N/A  . Number of Children: N/A  . Years of Education: N/A   Occupational History  . Not on file.   Social History Main Topics  . Smoking status: Never Smoker   . Smokeless tobacco: Not on file     Comment: Never smoked  . Alcohol Use: No  . Drug Use: No  . Sexual Activity: Not on file   Other Topics Concern  . Not on file   Social History Narrative   Lives with husband and 3 sons in a one story home.     Works in a Programme researcher, broadcasting/film/video.     Education level: some college.      Review of Systems: Negative unless mentioned in HPI  Physical Exam: BP 132/84 mmHg  Pulse 69  Temp(Src) 98.8 F (37.1 C) (Oral)  Ht  (1.727 m)  Wt 237 lb 6.4 oz (107.684 kg)  BMI 36.10 kg/m2  LMP  11/23/2014 (Approximate) General:   Alert and oriented. Pleasant and cooperative. Well-nourished and well-developed.  Head:  Normocephalic and atraumatic. Eyes:  Without icterus, sclera clear and conjunctiva pink.  Ears:  Normal auditory acuity. Nose:  No deformity, discharge,  or lesions. Mouth:  No deformity or lesions, oral mucosa pink.  Lungs:  Clear to auscultation bilaterally. No wheezes, rales, or rhonchi. No distress.  Heart:  S1, S2 present without murmurs appreciated.  Abdomen:  +BS, soft, non-tender and non-distended. No HSM noted. No guarding or rebound. No masses appreciated.  Rectal:  Deferred  Msk:  Symmetrical without gross deformities. Normal posture. Extremities:  Without edema. Neurologic:  Alert and  oriented x4;  grossly normal neurologically. Skin:  Intact without significant lesions or rashes. Psych:  Alert and cooperative. Normal mood and affect.

## 2014-12-08 DIAGNOSIS — R1314 Dysphagia, pharyngoesophageal phase: Secondary | ICD-10-CM | POA: Insufficient documentation

## 2014-12-08 DIAGNOSIS — R1013 Epigastric pain: Secondary | ICD-10-CM | POA: Insufficient documentation

## 2014-12-08 NOTE — Progress Notes (Signed)
CT reviewed and normal.

## 2014-12-08 NOTE — Assessment & Plan Note (Signed)
39 year old female with several month history of dyspepsia to include epigastric pain with eating solids and liquids, associated dysphagia, regurgitation despite PPI daily. No prior EGD. Query gastritis, PUD, esophagitis, dysphagia secondary to web, ring, stricture, uncontrolled GERD. Needs EGD with dilation. Due to significant symptoms of difficulty eating solids including some liquids, will proceed with stat CT now first to exclude outlet obstruction although this is less likely.   Proceed with upper endoscopy and dilation in the near future with Dr. Darrick PennaFields. The risks, benefits, and alternatives have been discussed in detail with patient. They have stated understanding and desire to proceed.  Multiple failures of PPI to include Protonix, Prilosec, Nexium. Currently prescribed Protonix but not taking due to severe upper GI symptoms. Consider Dexilant once EGD completed

## 2014-12-09 ENCOUNTER — Encounter (HOSPITAL_COMMUNITY): Payer: Self-pay | Admitting: *Deleted

## 2014-12-09 ENCOUNTER — Ambulatory Visit (HOSPITAL_COMMUNITY)
Admission: RE | Admit: 2014-12-09 | Discharge: 2014-12-09 | Disposition: A | Discharge status: Home / Self Care | Payer: Medicaid Other | Source: Ambulatory Visit | Attending: Gastroenterology | Admitting: Gastroenterology

## 2014-12-09 ENCOUNTER — Other Ambulatory Visit: Payer: Self-pay

## 2014-12-09 ENCOUNTER — Encounter (HOSPITAL_COMMUNITY)
Admission: RE | Disposition: A | Discharge status: Home / Self Care | Payer: Self-pay | Source: Ambulatory Visit | Attending: Gastroenterology

## 2014-12-09 ENCOUNTER — Telehealth: Payer: Self-pay | Admitting: Gastroenterology

## 2014-12-09 DIAGNOSIS — Z886 Allergy status to analgesic agent status: Secondary | ICD-10-CM | POA: Diagnosis not present

## 2014-12-09 DIAGNOSIS — J40 Bronchitis, not specified as acute or chronic: Secondary | ICD-10-CM | POA: Insufficient documentation

## 2014-12-09 DIAGNOSIS — K208 Other esophagitis: Secondary | ICD-10-CM | POA: Diagnosis not present

## 2014-12-09 DIAGNOSIS — Z9049 Acquired absence of other specified parts of digestive tract: Secondary | ICD-10-CM | POA: Insufficient documentation

## 2014-12-09 DIAGNOSIS — F329 Major depressive disorder, single episode, unspecified: Secondary | ICD-10-CM | POA: Diagnosis not present

## 2014-12-09 DIAGNOSIS — R1314 Dysphagia, pharyngoesophageal phase: Secondary | ICD-10-CM

## 2014-12-09 DIAGNOSIS — F419 Anxiety disorder, unspecified: Secondary | ICD-10-CM | POA: Insufficient documentation

## 2014-12-09 DIAGNOSIS — G4700 Insomnia, unspecified: Secondary | ICD-10-CM | POA: Insufficient documentation

## 2014-12-09 DIAGNOSIS — K222 Esophageal obstruction: Secondary | ICD-10-CM

## 2014-12-09 DIAGNOSIS — K2210 Ulcer of esophagus without bleeding: Secondary | ICD-10-CM | POA: Insufficient documentation

## 2014-12-09 DIAGNOSIS — K219 Gastro-esophageal reflux disease without esophagitis: Secondary | ICD-10-CM | POA: Diagnosis not present

## 2014-12-09 DIAGNOSIS — K2950 Unspecified chronic gastritis without bleeding: Secondary | ICD-10-CM | POA: Diagnosis not present

## 2014-12-09 DIAGNOSIS — R1310 Dysphagia, unspecified: Secondary | ICD-10-CM | POA: Diagnosis present

## 2014-12-09 DIAGNOSIS — R1013 Epigastric pain: Secondary | ICD-10-CM | POA: Diagnosis present

## 2014-12-09 HISTORY — PX: ESOPHAGOGASTRODUODENOSCOPY: SHX5428

## 2014-12-09 SURGERY — EGD (ESOPHAGOGASTRODUODENOSCOPY)
Anesthesia: Moderate Sedation

## 2014-12-09 MED ORDER — MEPERIDINE HCL 100 MG/ML IJ SOLN
INTRAMUSCULAR | Status: DC | PRN
  Administered 2014-12-09: 50 mg via INTRAVENOUS
  Administered 2014-12-09 (×2): 25 mg via INTRAVENOUS
  Administered 2014-12-09 (×2): 50 mg via INTRAVENOUS

## 2014-12-09 MED ORDER — ONDANSETRON HCL 4 MG/2ML IJ SOLN
INTRAMUSCULAR | Status: DC | PRN
  Administered 2014-12-09: 4 mg via INTRAVENOUS

## 2014-12-09 MED ORDER — STERILE WATER FOR IRRIGATION IR SOLN
Status: DC | PRN
  Administered 2014-12-09: 15:00:00

## 2014-12-09 MED ORDER — MEPERIDINE HCL 100 MG/ML IJ SOLN
INTRAMUSCULAR | Status: AC
  Filled 2014-12-09: qty 2

## 2014-12-09 MED ORDER — ONDANSETRON HCL 4 MG/2ML IJ SOLN
INTRAMUSCULAR | Status: AC
  Filled 2014-12-09: qty 2

## 2014-12-09 MED ORDER — SODIUM CHLORIDE 0.9 % IV SOLN
INTRAVENOUS | Status: DC
  Administered 2014-12-09: 14:00:00 via INTRAVENOUS

## 2014-12-09 MED ORDER — MIDAZOLAM HCL 5 MG/5ML IJ SOLN
INTRAMUSCULAR | Status: DC | PRN
  Administered 2014-12-09 (×5): 2 mg via INTRAVENOUS

## 2014-12-09 MED ORDER — PANTOPRAZOLE SODIUM 40 MG PO TBEC: DELAYED_RELEASE_TABLET | ORAL | Status: DC

## 2014-12-09 MED ORDER — LIDOCAINE VISCOUS 2 % MT SOLN
OROMUCOSAL | Status: AC
  Filled 2014-12-09: qty 15

## 2014-12-09 MED ORDER — MIDAZOLAM HCL 5 MG/5ML IJ SOLN
INTRAMUSCULAR | Status: AC
  Filled 2014-12-09: qty 10

## 2014-12-09 NOTE — Discharge Instructions (Signed)
You have gastritis.  You were agitated in spite of high doses of Demerol and versed AS WELL AS ZOFRAN. You need to come back for the UPPER ENDOSCOPY with a different sedating medication: PROPOFOL.   WE WILL Reschedule YOUR upper endoscopy in 1-2 weeks.  FOLLOW A soft mechANICL/LOW FAT DIET.  MEATS SHOULD BE CHOPPED OR GROUND ONLY. DO NOT EAT CHUNKS OF ANYTHING. SEE INFO BELOW.  CONTINUE YOUR WEIGHT LOSS EFFORTS.  TAKE PROTONIX. TAKE 30 MINUTES PRIOR TO MEALS TWICE DAILY.  USE CARAFATE OR ZANTAC AS NEEDED FOR HEARTBURN.  FOLLOW UP IN SEP 2016.   UPPER ENDOSCOPY AFTER CARE Read the instructions outlined below and refer to this sheet in the next week. These discharge instructions provide you with general information on caring for yourself after you leave the hospital. While your treatment has been planned according to the most current medical practices available, unavoidable complications occasionally occur. If you have any problems or questions after discharge, call DR. Loui Massenburg, [phone].  ACTIVITY  You may resume your regular activity, but move at a slower pace for the next 24 hours.   Take frequent rest periods for the next 24 hours.   Walking will help get rid of the air and reduce the bloated feeling in your belly (abdomen).   No driving for 24 hours (because of the medicine (anesthesia) used during the test).   You may shower.   Do not sign any important legal documents or operate any machinery for 24 hours (because of the anesthesia used during the test).    NUTRITION  Drink plenty of fluids.   You may resume your normal diet as instructed by your doctor.   Begin with a light meal and progress to your normal diet. Heavy or fried foods are harder to digest and may make you feel sick to your stomach (nauseated).   Avoid alcoholic beverages for 24 hours or as instructed.    MEDICATIONS  You may resume your normal medications.   WHAT YOU CAN EXPECT TODAY  Some  feelings of bloating in the abdomen.   Passage of more gas than usual.    IF YOU HAD A BIOPSY TAKEN DURING THE UPPER ENDOSCOPY:  Eat a soft diet IF YOU HAVE NAUSEA, BLOATING, ABDOMINAL PAIN, OR VOMITING.    FINDING OUT THE RESULTS OF YOUR TEST Not all test results are available during your visit. DR. Darrick Penna WILL CALL YOU WITHIN 7 DAYS OF YOUR PROCEDUE WITH YOUR RESULTS. Do not assume everything is normal if you have not heard from DR. Dorance Spink IN ONE WEEK, CALL HER OFFICE AT [phone].  SEEK IMMEDIATE MEDICAL ATTENTION AND CALL THE OFFICE: [phone] IF:  You have more than a spotting of blood in your stool.   Your belly is swollen (abdominal distention).   You are nauseated or vomiting.   You have a temperature over 101F.   You have abdominal pain or discomfort that is severe or gets worse throughout the day.   Gastritis  Gastritis is an inflammation (the body's way of reacting to injury and/or infection) of the stomach. DUODENITIS is an inflammation (the body's way of reacting to injury and/or infection) of the FIRST PART OF THE SMALL INTESTINES. It is often caused by bacterial (germ) infections. It can also be caused BY ASPIRIN, BC/GOODY POWDER'S, (IBUPROFEN) MOTRIN, OR ALEVE (NAPROXEN), chemicals (including alcohol), SPICY FOODS, and medications. This illness may be associated with generalized malaise (feeling tired, not well), UPPER ABDOMINAL STOMACH cramps, and fever. One common bacterial  cause of gastritis is an organism known as H. Pylori. This can be treated with antibiotics.     REFLUX   TREATMENT There are a number of medicines used to treat reflux including: Antacids.  ZANTAC Proton-pump inhibitors: PROTONIX  HOME CARE INSTRUCTIONS Eat 2-3 hours before going to bed.  Try to reach and maintain a healthy weight. LOSE 10-20 LBS Do not eat just a few very large meals. Instead, eat 4 TO 6 smaller meals throughout the day.  Try to identify foods and beverages  that make your symptoms worse, and avoid these.  Avoid tight clothing.  Do not exercise right after eating.   SOFT MECHANICAL DIET This SOFT MECHANICAL DIET is restricted to:  Foods that are moist, soft-textured, and easy to chew and swallow.   Meats that are ground or are minced no larger than one-quarter inch pieces. Meats are moist with gravy or sauce added.   Foods that do not include bread or bread-like textures except soft pancakes, well-moistened with syrup or sauce.   Textures with some chewing ability required.   Casseroles without rice.   Cooked vegetables that are less than half an inch in size and easily mashed with a fork. No cooked corn, peas, broccoli, cauliflower, cabbage, Brussels sprouts, asparagus, or other fibrous, non-tender or rubbery cooked vegetables.   Canned fruit except for pineapple. Fruit must be cut into pieces no larger than half an inch in size.   Foods that do not include nuts, seeds, coconut, or sticky textures.   FOOD TEXTURES FOR DYSPHAGIA DIET LEVEL 2 -SOFT MECHANICAL DIET (includes all foods on Dysphagia Diet Level 1 - Pureed, in addition to the foods listed below)  FOOD GROUP: Breads. RECOMMENDED: Soft pancakes, well-moistened with syrup or sauce.  AVOID: All others.  FOOD GROUP: Cereals.  RECOMMENDED: Cooked cereals with little texture, including oatmeal. Unprocessed wheat bran stirred into cereals for bulk. Note: If thin liquids are restricted, it is important that all of the liquid is absorbed into the cereal.  AVOID: All dry cereals and any cooked cereals that may contain flax seeds or other seeds or nuts. Whole-grain, dry, or coarse cereals. Cereals with nuts, seeds, dried fruit, and/or coconut.  FOOD GROUP: Desserts. RECOMMENDED: Pudding, custard. Soft fruit pies with bottom crust only. Canned fruit (excluding pineapple). Soft, moist cakes with icing.Frozen malts, milk shakes, frozen yogurt, eggnog, nutritional supplements, ice  cream, sherbet, regular or sugar-free gelatin, or any foods that become thin liquid at either room (70 F) or body temperature (98 F).  AVOID: Dry, coarse cakes and cookies. Anything with nuts, seeds, coconut, pineapple, or dried fruit. Breakfast yogurt with nuts. Rice or bread pudding.  FOOD GROUP: Fats. RECOMMENDED: Butter, margarine, cream for cereal (depending on liquid consistency recommendations), gravy, cream sauces, sour cream, sour cream dips with soft additives, mayonnaise, salad dressings, cream cheese, cream cheese spreads with soft additives, whipped toppings.  AVOID: All fats with coarse or chunky additives.  FOOD GROUP: Fruits. RECOMMENDED: Soft drained, canned, or cooked fruits without seeds or skin. Fresh soft and ripe banana. Fruit juices with a small amount of pulp. If thin liquids are restricted, fruit juices should be thickened to appropriate consistency.  AVOID: Fresh or frozen fruits. Cooked fruit with skin or seeds. Dried fruits. Fresh, canned, or cooked pineapple.  FOOD GROUP: Meats and Meat Substitutes. (Meat pieces should not exceed 1/4 of an inch cube and should be tender.) RECOMMENDED: Moistened ground or cooked meat, poultry, or fish. Moist ground or tender meat  may be served with gravy or sauce. Casseroles without rice. Moist macaroni and cheese, well-cooked pasta with meat sauce, tuna noodle casserole, soft, moist lasagna. Moist meatballs, meatloaf, or fish loaf. Protein salads, such as tuna or egg without large chunks, celery, or onion. Cottage cheese, smooth quiche without large chunks. Poached, scrambled, or soft-cooked eggs (egg yolks should not be runny but should be moist and able to be mashed with butter, margarine, or other moisture added to them). (Cook eggs to 160 F or use pasteurized eggs for safety.) Souffls may have small, soft chunks. Tofu. Well-cooked, slightly mashed, moist legumes, such as baked beans. All meats or protein substitutes should be  served with sauces or moistened to help maintain cohesiveness in the oral cavity.  AVOID: Dry meats, tough meats (such as bacon, sausage, hot dogs, bratwurst). Dry casseroles or casseroles with rice or large chunks. Peanut butter. Cheese slices and cubes. Hard-cooked or crisp fried eggs. Sandwiches.Pizza.  FOOD GROUP: Potatoes and Starches. RECOMMENDED: Well-cooked, moistened, boiled, baked, or mashed potatoes. Well-cooked shredded hash brown potatoes that are not crisp. (All potatoes need to be moist and in sauces.)Well-cooked noodles in sauce. Spaetzel or soft dumplings that have been moistened with butter or gravy.  AVOID: Potato skins and chips. Fried or French-fried potatoes. Rice.  FOOD GROUP: Soups. RECOMMENDED: Soups with easy-to-chew or easy-to-swallow meats or vegetables: Particle sizes in soups should be less than 1/2 inch. Soups will need to be thickened to appropriate consistency if soup is thinner than prescribed liquid consistency.  AVOID: Soups with large chunks of meat and vegetables. Soups with rice, corn, peas.  FOOD GROUP: Vegetables. RECOMMENDED: All soft, well-cooked vegetables. Vegetables should be less than a half inch. Should be easily mashed with a fork.  AVOID: Cooked corn and peas. Broccoli, cabbage, Brussels sprouts, asparagus, or other fibrous, non-tender or rubbery cooked vegetables.  FOOD GROUP: Miscellaneous. RECOMMENDED: Jams and preserves without seeds, jelly. Sauces, salsas, etc., that may have small tender chunks less than 1/2 inch. Soft, smooth chocolate bars that are easily chewed.  AVOID: Seeds, nuts, coconut, or sticky foods. Chewy candies such as caramels or licorice.    Low-Fat Diet BREADS, CEREALS, PASTA, RICE, DRIED PEAS, AND BEANS These products are high in carbohydrates and most are low in fat. Therefore, they can be increased in the diet as substitutes for fatty foods. They too, however, contain calories and should not be eaten in excess.  Cereals can be eaten for snacks as well as for breakfast.  Include foods that contain fiber (fruits, vegetables, whole grains, and legumes). Research shows that fiber may lower blood cholesterol levels, especially the water-soluble fiber found in fruits, vegetables, oat products, and legumes. FRUITS AND VEGETABLES It is good to eat fruits and vegetables. Besides being sources of fiber, both are rich in vitamins and some minerals. They help you get the daily allowances of these nutrients. Fruits and vegetables can be used for snacks and desserts. MEATS Limit lean meat, chicken, [place], and fish to no more than 6 ounces per day. Beef, Pork, and Lamb Use lean cuts of beef, pork, and lamb. Lean cuts include:  Extra-lean ground beef.  Arm roast.  Sirloin tip.  Center-cut ham.  Round steak.  Loin chops.  Rump roast.  Tenderloin.  Trim all fat off the outside of meats before cooking. It is not necessary to severely decrease the intake of red meat, but lean choices should be made. Lean meat is rich in protein and contains a highly absorbable form  of iron. Premenopausal women, in particular, should avoid reducing lean red meat because this could increase the risk for low red blood cells (iron-deficiency anemia).  Chicken and [place] These are good sources of protein. The fat of poultry can be reduced by removing the skin and underlying fat layers before cooking. Chicken and [place] can be substituted for lean red meat in the diet. Poultry should not be fried or covered with high-fat sauces. Fish and Shellfish Fish is a good source of protein. Shellfish contain cholesterol, but they usually are low in saturated fatty acids. The preparation of fish is important. Like chicken and [place], they should not be fried or covered with high-fat sauces. EGGS Egg whites contain no fat or cholesterol. They can be eaten often. Try 1 to 2 egg whites instead of whole eggs in recipes or use egg substitutes that do not  contain yolk.  MILK AND DAIRY PRODUCTS Use skim or 1% milk instead of 2% or whole milk. Decrease whole milk, natural, and processed cheeses. Use nonfat or low-fat (2%) cottage cheese or low-fat cheeses made from vegetable oils. Choose nonfat or low-fat (1 to 2%) yogurt. Experiment with evaporated skim milk in recipes that call for heavy cream. Substitute low-fat yogurt or low-fat cottage cheese for sour cream in dips and salad dressings. Have at least 2 servings of low-fat dairy products, such as 2 glasses of skim (or 1%) milk each day to help get your daily calcium intake.  FATS AND OILS Butterfat, lard, and beef fats are high in saturated fat and cholesterol. These should be avoided.Vegetable fats do not contain cholesterol. AVOID coconut oil, palm oil, and palm kernel oil, WHICH are very high in saturated fats. These should be limited. These fats are often used in bakery goods, processed foods, popcorn, oils, and nondairy creamers. Vegetable shortenings and some peanut butters contain hydrogenated oils, which are also saturated fats. Read the labels on these foods and check for saturated vegetable oils.  Desirable liquid vegetable oils are corn oil, cottonseed oil, olive oil, canola oil, safflower oil, soybean oil, and sunflower oil. Peanut oil is not as good, but small amounts are acceptable. Buy a heart-healthy tub margarine that has no partially hydrogenated oils in the ingredients. AVOID Mayonnaise and salad dressings often are made from unsaturated fats.  OTHER EATING TIPS Snacks  Most sweets should be limited as snacks. They tend to be rich in calories and fats, and their caloric content outweighs their nutritional value. Some good choices in snacks are graham crackers, melba toast, soda crackers, bagels (no egg), English muffins, fruits, and vegetables. These snacks are preferable to snack crackers, [place] fries, and chips. Popcorn should be air-popped or cooked in small amounts of liquid  vegetable oil.  Desserts Eat fruit, low-fat yogurt, and fruit ices instead of pastries, cake, and cookies. Sherbet, angel food cake, gelatin dessert, frozen low-fat yogurt, or other frozen products that do not contain saturated fat (pure fruit juice bars, frozen ice pops) are also acceptable.   COOKING METHODS Choose those methods that use little or no fat. They include: Poaching.  Braising.  Steaming.  Grilling.  Baking.  Stir-frying.  Broiling.  Microwaving.  Foods can be cooked in a nonstick pan without added fat, or use a nonfat cooking spray in regular cookware. Limit fried foods and avoid frying in saturated fat. Add moisture to lean meats by using water, broth, cooking wines, and other nonfat or low-fat sauces along with the cooking methods mentioned above. Soups and stews  should be chilled after cooking. The fat that forms on top after a few hours in the refrigerator should be skimmed off. When preparing meals, avoid using excess salt. Salt can contribute to raising blood pressure in some people.  EATING AWAY FROM HOME Order entres, potatoes, and vegetables without sauces or butter. When meat exceeds the size of a deck of cards (3 to 4 ounces), the rest can be taken home for another meal. Choose vegetable or fruit salads and ask for low-calorie salad dressings to be served on the side. Use dressings sparingly. Limit high-fat toppings, such as bacon, crumbled eggs, cheese, sunflower seeds, and olives. Ask for heart-healthy tub margarine instead of butter.

## 2014-12-09 NOTE — Progress Notes (Signed)
REVIEWED-NO ADDITIONAL RECOMMENDATIONS. 

## 2014-12-09 NOTE — Telephone Encounter (Signed)
PT NEED EGD/DIL WITH MAC JUN 14. PT FAILED CONSCIOUS SEDATION.

## 2014-12-09 NOTE — H&P (Signed)
  Primary Care Physician:  Lynwood DawleyGann, Tiffany, PA-C Primary Gastroenterologist:  Dr. Darrick PennaFields  Pre-Procedure History & Physical: HPI:  Savannah ScalesChristina M Ortiz is a 39 y.o. female here for DYSPHAGIA/abdominal pain.   Past Medical History  Diagnosis Date  . Insomnia   . Anxiety   . Allergic rhinitis   . Reactive airway disease   . Depression   . Bronchitis   . Sinusitis   . Exposure to hepatitis C     Exposure (Husband is positive)  . GERD (gastroesophageal reflux disease)     Past Surgical History  Procedure Laterality Date  . Reconstructive for feet and leg  1985  . Cholecystectomy  1997  . Carpal tunnel release  2010 & 2011  . Knee arthroscopy      Prior to Admission medications   Medication Sig Start Date End Date Taking? Authorizing Provider  MIRENA 20 MCG/24HR IUD  08/14/14   Historical Provider, MD  sucralfate (CARAFATE) 1 GM/10ML suspension Take 10 mLs (1 g total) by mouth 4 (four) times daily. 12/05/14   Nira RetortAnna W Sams, NP    Allergies as of 12/05/2014 - Review Complete 12/05/2014  Allergen Reaction Noted  . Codeine  12/16/2010    Family History  Problem Relation Age of Onset  . Diabetes Maternal Grandmother   . Ovarian cancer Mother   . Thyroid cancer Father   . Cancer Sister   . Cancer Maternal Grandmother   . Cancer Paternal Grandfather   . Healthy Daughter   . Healthy Son     x 3  . Colon cancer Neg Hx     History   Social History  . Marital Status: Single    Spouse Name: N/A  . Number of Children: N/A  . Years of Education: N/A   Occupational History  . Not on file.   Social History Main Topics  . Smoking status: Never Smoker   . Smokeless tobacco: Not on file     Comment: Never smoked  . Alcohol Use: No  . Drug Use: No  . Sexual Activity: Not on file   Other Topics Concern  . Not on file   Social History Narrative   Lives with husband and 3 sons in a one story home.     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     Education level: some college.       Review of Systems: See HPI, otherwise negative ROS   Physical Exam: BP 135/80 mmHg  Pulse 61  Temp(Src) 98.1 F (36.7 C) (Oral)  Resp 17  Ht 5\' 8"  (1.727 m)  Wt 237 lb (107.502 kg)  BMI 36.04 kg/m2  SpO2 97%  LMP 11/23/2014 (Approximate) General:   Alert,  pleasant and cooperative in NAD Head:  Normocephalic and atraumatic. Neck:  Supple; Lungs:  Clear throughout to auscultation.    Heart:  Regular rate and rhythm. Abdomen:  Soft, nontender and nondistended. Normal bowel sounds, without guarding, and without rebound.   Neurologic:  Alert and  oriented x4;  grossly normal neurologically.  Impression/Plan:     DYSPHAGIA  PLAN:  EGD/DIL TODAY

## 2014-12-10 ENCOUNTER — Telehealth: Payer: Self-pay

## 2014-12-10 ENCOUNTER — Other Ambulatory Visit: Payer: Self-pay | Admitting: Family Medicine

## 2014-12-10 ENCOUNTER — Other Ambulatory Visit: Payer: Self-pay

## 2014-12-10 MED ORDER — LIDOCAINE VISCOUS 2 % MT SOLN: OROMUCOSAL | Status: DC

## 2014-12-10 NOTE — Telephone Encounter (Signed)
Called pt and LMOM.  

## 2014-12-10 NOTE — Telephone Encounter (Signed)
Insurance has denied pt for her CT abd. Faxed records and the case was denied. Pt may need a peer to peer. Please Advise.

## 2014-12-10 NOTE — Telephone Encounter (Signed)
Last filled 11/09/14, last seen 11/07/14. If approved, route to pool A, [facility] [phone]

## 2014-12-10 NOTE — Op Note (Signed)
[facility] [address] [place], [phone]   ENDOSCOPY PROCEDURE REPORT  PATIENT: Savannah Ortiz, Savannah M  MR#: [id number] BIRTHDATE: 04-Oct-1975 , 39  yrs. old GENDER: female  ENDOSCOPIST: West BaliSandi L Addylin Manke, MD REFERRED BY:  PROCEDURE DATE: 12/09/2014 PROCEDURE:   EGD, diagnostic INDICATIONS:dyspepsia.   dysphagia. MEDICATIONS: Zofran 4mg  IV , Demerol 200 mg IV, and Versed 10 mg IV  TOPICAL ANESTHETIC: ASA CLASS:  DESCRIPTION OF PROCEDURE:     Physical exam was performed.  Informed consent was obtained from the patient after explaining the benefits, risks, and alternatives to the procedure.  The patient was connected to the monitor and placed in the left lateral position.  Continuous oxygen was provided by nasal cannula and IV medicine administered through an indwelling cannula.  After administration of sedation, the patients esophagus was intubated and the [id number] ([id number]  endoscope was advanced under direct visualization to the pylorus.  The scope was removed slowly by carefully examining the color, texture, anatomy, and integrity of the mucosa on the way out.  The patient was recovered in endoscopy and discharged home in satisfactory condition.   ESOPHAGUS: MULTIPLE LINEAR EROSIONS WITH ULCERATION.  PATENT STRICTURE PRESENT.   STOMACH: Moderate non-erosive gastritis (inflammation) was found in the gastric antrum.  COMPLICATIONS: PT AGITATED IN SPITE OF ADEQUATE SEDATION.  SCOPE ADVANCED TO PYLORUS AND WITHDRAWN.  ENDOSCOPIC IMPRESSION: 1.   SEVERE EROSIVE ESOPHAGITIS 2.   PATENT ESOPHAGEAL STRICTURE 3.   Non-erosive gastritis  RECOMMENDATIONS: Reschedule upper endoscopy in 1-2 weeks. FOLLOW A soft mechANICAL/LOW FAT DIET.  MEATS SHOULD BE CHOPPED OR GROUND ONLY.  DO NOT EAT CHUNKS OF ANYTHING. CONTINUE WEIGHT LOSS EFFORTS. TAKE PROTONIX 30 MINUTES PRIOR TO MEALS QD. USE CARAFATE OR ZANTAC AS NEEDED FOR HEARTBURN. FOLLOW UP IN SEP 2016.  REPEAT  EXAM:  eSigned:  West BaliSandi L Tavi Gaughran, MD 12/10/2014 2:10 PM   CPT CODES: ICD CODES:  The ICD and CPT codes recommended by this software are interpretations from the data that the clinical staff has captured with the software.  The verification of the translation of this report to the ICD and CPT codes and modifiers is the sole responsibility of the health care institution and practicing physician where this report was generated.  [employer] will not be held responsible for the validity of the ICD and CPT codes included on this report.  AMA assumes no liability for data contained or not contained herein. CPT is a Publishing rights managerregistered trademark of the [employer].

## 2014-12-10 NOTE — Telephone Encounter (Signed)
PLEASE CALL PT. RX SENT FOR VISCOUS LIDOCAINE.

## 2014-12-10 NOTE — Telephone Encounter (Signed)
Noted and pt is aware. She is set for 1245pm on 12/23/2014. Mailed instructions. Pt is aware of pre-op appointment. States that the Carafate is not working. Wants to know what else she can try

## 2014-12-10 NOTE — Addendum Note (Signed)
Addended by: West BaliFIELDS, SANDI L on: 12/10/2014 02:25 PM   Modules accepted: Orders

## 2014-12-10 NOTE — Telephone Encounter (Signed)
Noted  

## 2014-12-10 NOTE — Telephone Encounter (Signed)
Do we know why it was denied?

## 2014-12-11 ENCOUNTER — Encounter (HOSPITAL_COMMUNITY): Payer: Self-pay | Admitting: Gastroenterology

## 2014-12-11 NOTE — Telephone Encounter (Signed)
Insurance states that clinical notes do not support need.

## 2014-12-11 NOTE — Telephone Encounter (Signed)
She has already completed her CT.  This needs a peer to peer ASAP.

## 2014-12-11 NOTE — Telephone Encounter (Signed)
Call [employer] at [phone] Option 4 then Option 3 .   Case # [id number]

## 2014-12-11 NOTE — Telephone Encounter (Signed)
rx called into pharmacy

## 2014-12-11 NOTE — Telephone Encounter (Signed)
Ok to call in Akhiokiffany A. Chauncey ReadingGann PA-C

## 2014-12-12 NOTE — Telephone Encounter (Signed)
noted 

## 2014-12-12 NOTE — Telephone Encounter (Signed)
Spoke with the nurse reviewer Ala Dach(Arianne) to update information. She is sending for a review to the physician.

## 2014-12-16 NOTE — Patient Instructions (Signed)
Savannah Ortiz  12/16/2014     Your procedure is scheduled on 12/23/14.  Report to [facility] at 11:00 A.M.  Call this number if you have problems the morning of surgery:  [phone]   Remember:  Do not eat food or drink liquids after midnight.  Take these medicines the morning of surgery with A SIP OF WATER Pantoprazole   Do not wear jewelry, make-up or nail polish.  Do not wear lotions, powders, or perfumes.  You may wear deodorant.  Do not bring valuables to the hospital.  [facility] is not responsible for any belongings or valuables.  Contacts, dentures or bridgework may not be worn into surgery.  Leave your suitcase in the car.  After surgery it may be brought to your room.  For patients admitted to the hospital, discharge time will be determined by your treatment team.  Patients discharged the day of surgery will not be allowed to drive home.   Please read over the following fact sheets that you were given. Anesthesia Post-op Instructions   Esophageal Dilatation The esophagus is the long, narrow tube which carries food and liquid from the mouth to the stomach. Esophageal dilatation is the technique used to stretch a blocked or narrowed portion of the esophagus. This procedure is used when a part of the esophagus has become so narrow that it becomes difficult, painful or even impossible to swallow. This is generally an uncomplicated form of treatment. When this is not successful, chest surgery may be required. This is a much more extensive form of treatment with a longer recovery time. CAUSES  Some of the more common causes of blockage or strictures of the esophagus are:  Narrowing from longstanding inflammation (soreness and redness) of the lower esophagus. This comes from the constant exposure of the lower esophagus to the acid which bubbles up from the stomach. Over time this causes scarring and narrowing of the lower esophagus.  Hiatal hernia in which a small part  of the stomach bulges (herniates) up through the diaphragm. This can cause a gradual narrowing of the end of the esophagus.  Schatzki ring is a narrow ring of benign (non-cancerous) fibrous tissue which constricts the lower esophagus. The reason for this is not known.  Scleroderma is a connective tissue disorder that affects the esophagus and makes swallowing difficult.  Achalasia is an absence of nerves to the lower esophagus and to the esophageal sphincter. This is the circular muscle between the stomach and esophagus that relaxes to allow food into the stomach. After swallowing, it contracts to keep food in the stomach. This absence of nerves may be congenital (present since birth). This can cause irregular spasms of the lower esophageal muscle. This spasm does not open up to allow food and fluid through. The result is a persistent blockage with subsequent slow trickling of the esophageal contents into the stomach.  Strictures may develop from swallowing materials which damage the esophagus. Some examples are strong acids or alkalis such as lye.  Growths such as benign (non-cancerous) and malignant (cancerous) tumors can block the esophagus.  Hereditary (present since birth) causes. DIAGNOSIS  Your caregiver often suspects this problem by taking a medical history. They will also do a physical exam. They can then prove their suspicions using X-rays and endoscopy. Endoscopy is an exam in which a tube like a small, flexible telescope is used to look at your esophagus.  TREATMENT There are different stretching (dilating) techniques that can be used. Simple  bougie dilatation may be done in the office. This usually takes only a couple minutes. A numbing (anesthetic) spray of the throat is used. Endoscopy, when done, is done in an endoscopy suite under mild sedation. When fluoroscopy is used, the procedure is performed in X-ray. Other techniques require a little longer time. Recovery is usually quick.  There is no waiting time to begin eating and drinking to test success of the treatment. Following are some of the methods used. Narrowing of the esophagus is treated by making it bigger. Commonly this is a mechanical problem which can be treated with stretching. This can be done in different ways. Your caregiver will discuss these with you. Some of the means used are:  A series of graduated (increasing thickness) flexible dilators can be used. These are weighted tubes passed through the esophagus into the stomach. The tubes used become progressively larger until the desired stretched size is reached. Graduated dilators are a simple and quick way of opening the esophagus. No visualization is required.  Another method is the use of endoscopy to place a flexible wire across the stricture. The endoscope is removed and the wire left in place. A dilator with a hole through it from end to end is guided down the esophagus and across the stricture. One or more of these dilators are passed over the wire. At the end of the exam, the wire is removed. This type of treatment may be performed in the X-ray department under fluoroscopy. An advantage of this procedure is the examiner is visualizing the end opening in the esophagus.  Stretching of the esophagus may be done using balloons. Deflated balloons are placed through the endoscope and across the stricture. This type of balloon dilatation is often done at the time of endoscopy or fluoroscopy. Flexible endoscopy allows the examiner to directly view the stricture. A balloon is inserted in the deflated form into the area of narrowing. It is then inflated with air to a certain pressure that is preset for a given circumference. When inflated, it becomes sausage shaped, stretched, and makes the stricture larger.  Achalasia requires a longer, larger balloon-type dilator. This is frequently done under X-ray control. In this situation, the spastic muscle fibers in the lower  esophagus are stretched. All of the above procedures make the passage of food and water into the stomach easier. They also make it easier for stomach contents to reflux back into the esophagus. Special medications may be used following the procedure to help prevent further stricturing. Proton-pump inhibitor medications are good at decreasing the amount of acid in the stomach juice. When stomach juice refluxes into the esophagus, the juice is no longer as acidic and is less likely to burn or scar the esophagus. RISKS AND COMPLICATIONS Esophageal dilatation is usually performed effectively and without problems. Some complications that can occur are:  A small amount of bleeding almost always happens where the stretching takes place. If this is too excessive it may require more aggressive treatment.  An uncommon complication is perforation (making a hole) of the esophagus. The esophagus is thin. It is easy to make a hole in it. If this happens, an operation may be necessary to repair this.  A small, undetected perforation could lead to an infection in the chest. This can be very serious. HOME CARE INSTRUCTIONS   If you received sedation for your procedure, do not drive, make important decisions, or perform any activities requiring your full coordination. Do not drink alcohol, take sedatives, or  use any mind altering chemicals unless instructed by your caregiver.  You may use throat lozenges or warm salt water gargles if you have throat discomfort.  You can begin eating and drinking normally on return home unless instructed otherwise. Do not purposely try to force large chunks of food down to test the benefits of your procedure.  Mild discomfort can be eased with sips of ice water.  Medications for discomfort may or may not be needed. SEEK IMMEDIATE MEDICAL CARE IF:   You begin vomiting up blood.  You develop black, tarry stools.  You develop chills or an unexplained temperature of over 101F  (38.3C)  You develop chest or abdominal pain.  You develop shortness of breath, or feel light-headed or faint.  Your swallowing is becoming more painful, difficult, or you are unable to swallow. MAKE SURE YOU:   Understand these instructions.  Will watch your condition.  Will get help right away if you are not doing well or get worse. Document Released: 08/18/2005 Document Revised: 11/11/2013 Document Reviewed: 10/05/2005 [facility] Patient Information 2015 [place], [place]. This information is not intended to replace advice given to you by your health care provider. Make sure you discuss any questions you have with your health care provider.    PATIENT INSTRUCTIONS POST-ANESTHESIA  IMMEDIATELY FOLLOWING SURGERY:  Do not drive or operate machinery for the first twenty four hours after surgery.  Do not make any important decisions for twenty four hours after surgery or while taking narcotic pain medications or sedatives.  If you develop intractable nausea and vomiting or a severe headache please notify your doctor immediately.  FOLLOW-UP:  Please make an appointment with your surgeon as instructed. You do not need to follow up with anesthesia unless specifically instructed to do so.  WOUND CARE INSTRUCTIONS (if applicable):  Keep a dry clean dressing on the anesthesia/puncture wound site if there is drainage.  Once the wound has quit draining you may leave it open to air.  Generally you should leave the bandage intact for twenty four hours unless there is drainage.  If the epidural site drains for more than 36-48 hours please call the anesthesia department.  QUESTIONS?:  Please feel free to call your physician or the hospital operator if you have any questions, and they will be happy to assist you.

## 2014-12-17 ENCOUNTER — Encounter (HOSPITAL_COMMUNITY): Payer: Self-pay

## 2014-12-17 ENCOUNTER — Other Ambulatory Visit (HOSPITAL_COMMUNITY): Payer: Medicaid Other

## 2014-12-17 ENCOUNTER — Encounter (HOSPITAL_COMMUNITY)
Admission: RE | Admit: 2014-12-17 | Discharge: 2014-12-17 | Disposition: A | Discharge status: Home / Self Care | Payer: Medicaid Other | Source: Ambulatory Visit | Attending: Gastroenterology | Admitting: Gastroenterology

## 2014-12-17 DIAGNOSIS — Z01818 Encounter for other preprocedural examination: Secondary | ICD-10-CM | POA: Diagnosis present

## 2014-12-17 LAB — BASIC METABOLIC PANEL
ANION GAP: 9 (ref 5–15)
BUN: 16 mg/dL (ref 6–20)
CALCIUM: 9.5 mg/dL (ref 8.9–10.3)
CO2: 30 mmol/L (ref 22–32)
CREATININE: 0.69 mg/dL (ref 0.44–1.00)
Chloride: 101 mmol/L (ref 101–111)
GFR calc Af Amer: >60 mL/min (ref >60)
Glucose, Bld: 118 mg/dL — ABNORMAL HIGH (ref 65–99)
Potassium: 4.4 mmol/L (ref 3.5–5.1)
Sodium: 140 mmol/L (ref 135–145)

## 2014-12-17 LAB — CBC
HCT: 41.8 % (ref 36.0–46.0)
Hemoglobin: 13.8 g/dL (ref 12.0–15.0)
MCH: 30.7 pg (ref 26.0–34.0)
MCHC: 33 g/dL (ref 30.0–36.0)
MCV: 92.9 fL (ref 78.0–100.0)
Platelets: 244 10*3/uL (ref 150–400)
RBC: 4.5 MIL/uL (ref 3.87–5.11)
RDW: 14.4 % (ref 11.5–15.5)
WBC: 11.9 10*3/uL — ABNORMAL HIGH (ref 4.0–10.5)

## 2014-12-17 LAB — HCG, SERUM, QUALITATIVE: Preg, Serum: NEGATIVE

## 2014-12-17 NOTE — Pre-Procedure Instructions (Signed)
Patient given information to sign up for my chart at home. Patient does not want any information to be given to person bringing her. She wants discharge instructions to be placed in sealed envelope and sent home with her.

## 2014-12-17 NOTE — Telephone Encounter (Signed)
Therapist, occupationalnsurance approval # [id number]

## 2014-12-18 ENCOUNTER — Telehealth: Payer: Self-pay | Admitting: Neurology

## 2014-12-18 NOTE — Telephone Encounter (Signed)
Called patient back and she is still in a lot of pain.  She had appointment with Dr. Murray Hodgkins today but since they did not mail her the paperwork, she did not have it with her and they would not see her.  She is going back to him on June 28.  She is going to ask her PCP if they can give her something for pain to get her through until she can start injections.

## 2014-12-18 NOTE — Telephone Encounter (Signed)
Pt wants to talk to Savannah Ortiz as soon as possible about her going to the DR we referred her to [phone]

## 2014-12-19 ENCOUNTER — Telehealth: Payer: Self-pay | Admitting: Physician Assistant

## 2014-12-19 ENCOUNTER — Other Ambulatory Visit: Payer: Self-pay | Admitting: Physician Assistant

## 2014-12-19 DIAGNOSIS — M792 Neuralgia and neuritis, unspecified: Secondary | ICD-10-CM

## 2014-12-19 MED ORDER — GABAPENTIN 300 MG PO CAPS: 300.0000 mg | ORAL_CAPSULE | Freq: Three times a day (TID) | ORAL | Status: AC

## 2014-12-19 NOTE — Telephone Encounter (Signed)
Pt aware by VM that rx was at [facility]

## 2014-12-19 NOTE — Telephone Encounter (Addendum)
Savannah Ortiz referred Dr Allena Katz Allena Katz referred to a different specialist --  appt is at end of JUne.  Needs pain meds until then - the most she has had is IBU 800   Dr Jill Alexanders office (her nurse Savannah Ortiz) said it was ok for her to have something - but they do not RX pain meds. (per pt)  Wants to know if Savannah Ortiz will give her something for the next 2-3 weeks that is stronger than IBU.

## 2014-12-19 NOTE — Telephone Encounter (Signed)
Prescription sent to pharmacy - [facility] in [place] for Neurontin

## 2014-12-22 ENCOUNTER — Telehealth: Payer: Self-pay | Admitting: Physician Assistant

## 2014-12-22 NOTE — Telephone Encounter (Signed)
lmtcb to clarify if pt is needing a new Rx for the gabapentin since she is taking it differently and probably does not have enough meds now or needing different Rx.

## 2014-12-22 NOTE — Telephone Encounter (Signed)
Pt is taking gabapentin 300mg  3 tabs TID which is not helping her pain, would like to know if another pain med can be sent in for next 2-3 weeks until she sees the [facility] on 6/28

## 2014-12-22 NOTE — Progress Notes (Signed)
CC'ED TO PCP 

## 2014-12-22 NOTE — Telephone Encounter (Signed)
According to notes, her pain is more neuropathic and Gabapentin would be a good option. I discussed the medication to prescribe with Dr. Hyacinth Meeker and he suggested Gabapentin since we have no record of her taking it in addition to the fact that she is allergic to codeine, ruling out Vicodin and Tramadol.I will not prescribe percocet and options are limited.. Lorenso Quirino A. Chauncey Reading PA-C

## 2014-12-23 ENCOUNTER — Ambulatory Visit (HOSPITAL_COMMUNITY)
Admission: RE | Admit: 2014-12-23 | Discharge: 2014-12-23 | Disposition: A | Discharge status: Home / Self Care | Payer: Medicaid Other | Source: Ambulatory Visit | Attending: Gastroenterology | Admitting: Gastroenterology

## 2014-12-23 ENCOUNTER — Encounter (HOSPITAL_COMMUNITY): Payer: Self-pay | Admitting: *Deleted

## 2014-12-23 ENCOUNTER — Encounter (HOSPITAL_COMMUNITY)
Admission: RE | Disposition: A | Discharge status: Home / Self Care | Payer: Self-pay | Source: Ambulatory Visit | Attending: Gastroenterology

## 2014-12-23 ENCOUNTER — Ambulatory Visit (HOSPITAL_COMMUNITY): Payer: Medicaid Other | Admitting: Anesthesiology

## 2014-12-23 ENCOUNTER — Ambulatory Visit: Admit: 2014-12-23 | Payer: Self-pay | Admitting: Gastroenterology

## 2014-12-23 ENCOUNTER — Other Ambulatory Visit: Payer: Self-pay | Admitting: Physician Assistant

## 2014-12-23 DIAGNOSIS — Z885 Allergy status to narcotic agent status: Secondary | ICD-10-CM | POA: Diagnosis not present

## 2014-12-23 DIAGNOSIS — F329 Major depressive disorder, single episode, unspecified: Secondary | ICD-10-CM | POA: Diagnosis not present

## 2014-12-23 DIAGNOSIS — K2970 Gastritis, unspecified, without bleeding: Secondary | ICD-10-CM | POA: Diagnosis not present

## 2014-12-23 DIAGNOSIS — K222 Esophageal obstruction: Secondary | ICD-10-CM | POA: Diagnosis not present

## 2014-12-23 DIAGNOSIS — Z79899 Other long term (current) drug therapy: Secondary | ICD-10-CM | POA: Diagnosis not present

## 2014-12-23 DIAGNOSIS — K209 Esophagitis, unspecified: Secondary | ICD-10-CM | POA: Diagnosis not present

## 2014-12-23 DIAGNOSIS — F419 Anxiety disorder, unspecified: Secondary | ICD-10-CM | POA: Insufficient documentation

## 2014-12-23 DIAGNOSIS — R1310 Dysphagia, unspecified: Secondary | ICD-10-CM

## 2014-12-23 DIAGNOSIS — K219 Gastro-esophageal reflux disease without esophagitis: Secondary | ICD-10-CM | POA: Insufficient documentation

## 2014-12-23 DIAGNOSIS — G4700 Insomnia, unspecified: Secondary | ICD-10-CM | POA: Diagnosis not present

## 2014-12-23 DIAGNOSIS — K2980 Duodenitis without bleeding: Secondary | ICD-10-CM | POA: Diagnosis not present

## 2014-12-23 DIAGNOSIS — K2960 Other gastritis without bleeding: Secondary | ICD-10-CM | POA: Diagnosis not present

## 2014-12-23 HISTORY — PX: ESOPHAGEAL DILATION: SHX303

## 2014-12-23 HISTORY — PX: BIOPSY: SHX5522

## 2014-12-23 HISTORY — PX: ESOPHAGOGASTRODUODENOSCOPY (EGD) WITH PROPOFOL: SHX5813

## 2014-12-23 SURGERY — ESOPHAGOGASTRODUODENOSCOPY (EGD) WITH PROPOFOL
Anesthesia: Monitor Anesthesia Care

## 2014-12-23 MED ORDER — LIDOCAINE HCL (PF) 1 % IJ SOLN
INTRAMUSCULAR | Status: AC
  Filled 2014-12-23: qty 5

## 2014-12-23 MED ORDER — SODIUM CHLORIDE 0.9 % IJ SOLN
INTRAMUSCULAR | Status: AC
  Filled 2014-12-23: qty 10

## 2014-12-23 MED ORDER — LIDOCAINE VISCOUS 2 % MT SOLN: OROMUCOSAL | Status: DC

## 2014-12-23 MED ORDER — STERILE WATER FOR IRRIGATION IR SOLN
Status: DC | PRN
  Administered 2014-12-23: 1000 mL

## 2014-12-23 MED ORDER — FENTANYL CITRATE (PF) 100 MCG/2ML IJ SOLN
25.0000 ug | INTRAMUSCULAR | Status: AC
  Administered 2014-12-23 (×2): 25 ug via INTRAVENOUS

## 2014-12-23 MED ORDER — LACTATED RINGERS IV SOLN
INTRAVENOUS | Status: DC
  Administered 2014-12-23: 08:00:00 via INTRAVENOUS

## 2014-12-23 MED ORDER — MIDAZOLAM HCL 2 MG/2ML IJ SOLN
INTRAMUSCULAR | Status: AC
  Filled 2014-12-23: qty 2

## 2014-12-23 MED ORDER — LIDOCAINE HCL (CARDIAC) 10 MG/ML IV SOLN
INTRAVENOUS | Status: DC | PRN
  Administered 2014-12-23: 50 mg via INTRAVENOUS

## 2014-12-23 MED ORDER — PANTOPRAZOLE SODIUM 40 MG PO TBEC: DELAYED_RELEASE_TABLET | ORAL | Status: DC

## 2014-12-23 MED ORDER — PROPOFOL 10 MG/ML IV BOLUS
INTRAVENOUS | Status: AC
  Filled 2014-12-23: qty 20

## 2014-12-23 MED ORDER — FENTANYL CITRATE (PF) 100 MCG/2ML IJ SOLN
INTRAMUSCULAR | Status: AC
  Filled 2014-12-23: qty 2

## 2014-12-23 MED ORDER — MIDAZOLAM HCL 5 MG/5ML IJ SOLN
INTRAMUSCULAR | Status: DC | PRN
  Administered 2014-12-23: 2 mg via INTRAVENOUS

## 2014-12-23 MED ORDER — ONDANSETRON HCL 4 MG/2ML IJ SOLN
INTRAMUSCULAR | Status: AC
  Filled 2014-12-23: qty 2

## 2014-12-23 MED ORDER — TRAMADOL HCL 50 MG PO TABS: ORAL_TABLET | ORAL | Status: DC

## 2014-12-23 MED ORDER — FENTANYL CITRATE (PF) 100 MCG/2ML IJ SOLN
INTRAMUSCULAR | Status: AC
Start: 2014-12-23 — End: 2014-12-23
  Filled 2014-12-23: qty 2

## 2014-12-23 MED ORDER — FENTANYL CITRATE (PF) 100 MCG/2ML IJ SOLN
25.0000 ug | INTRAMUSCULAR | Status: DC | PRN
  Administered 2014-12-23 (×3): 50 ug via INTRAVENOUS

## 2014-12-23 MED ORDER — FENTANYL CITRATE (PF) 100 MCG/2ML IJ SOLN
INTRAMUSCULAR | Status: DC | PRN
  Administered 2014-12-23 (×2): 50 ug via INTRAVENOUS

## 2014-12-23 MED ORDER — MIDAZOLAM HCL 2 MG/2ML IJ SOLN
1.0000 mg | INTRAMUSCULAR | Status: DC | PRN
  Administered 2014-12-23 (×2): 2 mg via INTRAVENOUS

## 2014-12-23 MED ORDER — ONDANSETRON HCL 4 MG/2ML IJ SOLN: 4.0000 mg | Freq: Once | INTRAMUSCULAR | Status: DC | PRN

## 2014-12-23 MED ORDER — LIDOCAINE VISCOUS 2 % MT SOLN: 10.0000 mL | Freq: Three times a day (TID) | OROMUCOSAL | Status: DC

## 2014-12-23 MED ORDER — EPHEDRINE SULFATE 50 MG/ML IJ SOLN
INTRAMUSCULAR | Status: AC
  Filled 2014-12-23: qty 1

## 2014-12-23 MED ORDER — PROPOFOL INFUSION 10 MG/ML OPTIME
INTRAVENOUS | Status: DC | PRN
  Administered 2014-12-23: 75 ug/kg/min via INTRAVENOUS
  Administered 2014-12-23: 09:00:00 via INTRAVENOUS

## 2014-12-23 MED ORDER — MINERAL OIL PO OIL
TOPICAL_OIL | ORAL | Status: AC
  Filled 2014-12-23: qty 60

## 2014-12-23 MED ORDER — ONDANSETRON HCL 4 MG/2ML IJ SOLN
4.0000 mg | Freq: Once | INTRAMUSCULAR | Status: AC
  Administered 2014-12-23: 4 mg via INTRAVENOUS

## 2014-12-23 MED ORDER — LIDOCAINE VISCOUS 2 % MT SOLN
3.0000 mL | Freq: Once | OROMUCOSAL | Status: AC
  Administered 2014-12-23: 3 mL via OROMUCOSAL

## 2014-12-23 MED ORDER — LIDOCAINE VISCOUS 2 % MT SOLN
OROMUCOSAL | Status: AC
  Filled 2014-12-23: qty 15

## 2014-12-23 SURGICAL SUPPLY — 28 items
BALLN CRE LF 10-12 240X5.5 (BALLOONS)
BALLN CRE LF 10-12MM 240X5.5 (BALLOONS)
BALLN DILATOR CRE 12-15 240 (BALLOONS)
BALLN DILATOR CRE 15-18 240 (BALLOONS) IMPLANT
BALLN DILATOR CRE 18-20 240 (BALLOONS) IMPLANT
BALLN DILATOR CRE WIREGUIDE (BALLOONS)
BALLOON CRE LF 10-12 240X5.5 (BALLOONS) IMPLANT
BALLOON DILATOR CRE 12-15 240 (BALLOONS) IMPLANT
BALLOON DILATOR CRE WIREGUIDE (BALLOONS) IMPLANT
BLOCK BITE 60FR ADLT L/F BLUE (MISCELLANEOUS) ×3 IMPLANT
ELECT REM PT RETURN 9FT ADLT (ELECTROSURGICAL)
ELECTRODE REM PT RTRN 9FT ADLT (ELECTROSURGICAL) IMPLANT
FLOOR PAD 36X40 (MISCELLANEOUS) ×3
FORCEPS BIOP RAD 4 LRG CAP 4 (CUTTING FORCEPS) ×3 IMPLANT
FORMALIN 10 PREFIL 20ML (MISCELLANEOUS) ×3 IMPLANT
KIT ENDO PROCEDURE PEN (KITS) ×3 IMPLANT
MANIFOLD NEPTUNE II (INSTRUMENTS) ×3 IMPLANT
NEEDLE SCLEROTHERAPY 25GX240 (NEEDLE) IMPLANT
OVERTUBE ENDOCUFF GREEN (MISCELLANEOUS) IMPLANT
PAD FLOOR 36X40 (MISCELLANEOUS) ×1 IMPLANT
PROBE APC STR FIRE (PROBE) IMPLANT
PROBE INJECTION GOLD (MISCELLANEOUS)
PROBE INJECTION GOLD 7FR (MISCELLANEOUS) IMPLANT
SNARE SHORT THROW 13M SML OVAL (MISCELLANEOUS) IMPLANT
SYR INFLATE BILIARY GAUGE (MISCELLANEOUS) IMPLANT
SYR INFLATION 60ML (SYRINGE) IMPLANT
TUBING IRRIGATION ENDOGATOR (MISCELLANEOUS) ×3 IMPLANT
WATER STERILE IRR 1000ML POUR (IV SOLUTION) ×3 IMPLANT

## 2014-12-23 NOTE — Anesthesia Preprocedure Evaluation (Signed)
Anesthesia Evaluation  Patient identified by MRN, date of birth, ID band Patient awake    Reviewed: Allergy & Precautions, NPO status , Patient's Chart, lab work & pertinent test results  Airway Mallampati: II  TM Distance: >3 FB     Dental  (+) Teeth Intact, Partial Lower   Pulmonary neg pulmonary ROS,  breath sounds clear to auscultation        Cardiovascular negative cardio ROS  Rhythm:Regular Rate:Normal     Neuro/Psych PSYCHIATRIC DISORDERS Anxiety Depression    GI/Hepatic GERD-  Medicated,  Endo/Other    Renal/GU      Musculoskeletal   Abdominal   Peds  Hematology   Anesthesia Other Findings   Reproductive/Obstetrics                             Anesthesia Physical Anesthesia Plan  ASA: II  Anesthesia Plan: MAC   Post-op Pain Management:    Induction: Intravenous  Airway Management Planned: Simple Face Mask  Additional Equipment:   Intra-op Plan:   Post-operative Plan:   Informed Consent: I have reviewed the patients History and Physical, chart, labs and discussed the procedure including the risks, benefits and alternatives for the proposed anesthesia with the patient or authorized representative who has indicated his/her understanding and acceptance.     Plan Discussed with:   Anesthesia Plan Comments:         Anesthesia Quick Evaluation

## 2014-12-23 NOTE — Telephone Encounter (Signed)
Rx for Tramadol printed. Will leave at front desk Tiffany A. Chauncey Reading PA-C

## 2014-12-23 NOTE — Telephone Encounter (Signed)
Patient aware rx will be up front to be picked up  

## 2014-12-23 NOTE — Interval H&P Note (Signed)
History and Physical Interval Note:  12/23/2014 8:26 AM  Savannah Ortiz  has presented today for surgery, with the diagnosis of dysphagia  The various methods of treatment have been discussed with the patient and family. After consideration of risks, benefits and other options for treatment, the patient has consented to  Procedure(s) with comments: ESOPHAGOGASTRODUODENOSCOPY (EGD) WITH PROPOFOL (N/A) - 1230 - moved to 8:30 - office notified pt ESOPHAGEAL DILATION (N/A) as a surgical intervention .  The patient's history has been reviewed, patient examined, no change in status, stable for surgery.  I have reviewed the patient's chart and labs.  Questions were answered to the patient's satisfaction.     [employer]

## 2014-12-23 NOTE — Telephone Encounter (Signed)
TC to pt this morning, She has had Tramadol in the past here from [facility] with no reaction or problems, just needed until she see the specialist on 6/28

## 2014-12-23 NOTE — Anesthesia Procedure Notes (Signed)
Procedure Name: MAC Date/Time: 12/23/2014 8:50 AM Performed by: Pernell Dupre, AMY A Pre-anesthesia Checklist: Patient identified, Timeout performed, Emergency Drugs available, Suction available and Patient being monitored Oxygen Delivery Method: Simple face mask

## 2014-12-23 NOTE — Transfer of Care (Addendum)
Immediate Anesthesia Transfer of Care Note  Patient: Savannah Ortiz  Procedure(s) Performed: Procedure(s): ESOPHAGOGASTRODUODENOSCOPY (EGD) WITH PROPOFOL (N/A) ESOPHAGEAL DILATION, 71fr , 35fr, 39fr (N/A) GASTRIC BIOPSIES (N/A)  Patient Location: PACU  Anesthesia Type:MAC  Level of Consciousness: awake, alert , oriented and patient cooperative  Airway & Oxygen Therapy: Patient Spontanous Breathing and Patient connected to nasal cannula oxygen  Post-op Assessment: Report given to RN and Post -op Vital signs reviewed and stable  Post vital signs: Reviewed and stable  Last Vitals:  Filed Vitals:   12/23/14 0834  BP: 158/74  Pulse:   Temp:   Resp: 18    Complications: No apparent anesthesia complications

## 2014-12-23 NOTE — H&P (View-Only) (Signed)
  Primary Care Physician:  Lynwood Dawley, PA-C Primary Gastroenterologist:  Dr. Darrick Penna  Pre-Procedure History & Physical: HPI:  Savannah Ortiz is a 39 y.o. female here for DYSPHAGIA/abdominal pain.   Past Medical History  Diagnosis Date  . Insomnia   . Anxiety   . Allergic rhinitis   . Reactive airway disease   . Depression   . Bronchitis   . Sinusitis   . Exposure to hepatitis C     Exposure (Husband is positive)  . GERD (gastroesophageal reflux disease)     Past Surgical History  Procedure Laterality Date  . Reconstructive for feet and leg  1985  . Cholecystectomy  1997  . Carpal tunnel release  2010 & 2011  . Knee arthroscopy      Prior to Admission medications   Medication Sig Start Date End Date Taking? Authorizing Provider  MIRENA 20 MCG/24HR IUD  08/14/14   Historical Provider, MD  sucralfate (CARAFATE) 1 GM/10ML suspension Take 10 mLs (1 g total) by mouth 4 (four) times daily. 12/05/14   Nira Retort, NP    Allergies as of 12/05/2014 - Review Complete 12/05/2014  Allergen Reaction Noted  . Codeine  12/16/2010    Family History  Problem Relation Age of Onset  . Diabetes Maternal Grandmother   . Ovarian cancer Mother   . Thyroid cancer Father   . Cancer Sister   . Cancer Maternal Grandmother   . Cancer Paternal Grandfather   . Healthy Daughter   . Healthy Son     x 3  . Colon cancer Neg Hx     History   Social History  . Marital Status: Single    Spouse Name: N/A  . Number of Children: N/A  . Years of Education: N/A   Occupational History  . Not on file.   Social History Main Topics  . Smoking status: Never Smoker   . Smokeless tobacco: Not on file     Comment: Never smoked  . Alcohol Use: No  . Drug Use: No  . Sexual Activity: Not on file   Other Topics Concern  . Not on file   Social History Narrative   Lives with husband and 3 sons in a one story home.     Works in a Programme researcher, broadcasting/film/video.     Education level: some college.       Review of Systems: See HPI, otherwise negative ROS   Physical Exam: BP 135/80 mmHg  Pulse 61  Temp(Src) 98.1 F (36.7 C) (Oral)  Resp 17  Ht 5\' 8"  (1.727 m)  Wt 237 lb (107.502 kg)  BMI 36.04 kg/m2  SpO2 97%  LMP 11/23/2014 (Approximate) General:   Alert,  pleasant and cooperative in NAD Head:  Normocephalic and atraumatic. Neck:  Supple; Lungs:  Clear throughout to auscultation.    Heart:  Regular rate and rhythm. Abdomen:  Soft, nontender and nondistended. Normal bowel sounds, without guarding, and without rebound.   Neurologic:  Alert and  oriented x4;  grossly normal neurologically.  Impression/Plan:     DYSPHAGIA  PLAN:  EGD/DIL TODAY

## 2014-12-23 NOTE — Anesthesia Postprocedure Evaluation (Signed)
  Anesthesia Post-op Note  Patient: Savannah Ortiz  Procedure(s) Performed: Procedure(s): ESOPHAGOGASTRODUODENOSCOPY (EGD) WITH PROPOFOL (N/A) ESOPHAGEAL DILATION, 8fr , 57fr, 3fr (N/A) GASTRIC BIOPSIES (N/A)  Patient Location: PACU  Anesthesia Type:MAC  Level of Consciousness: awake, alert , oriented and patient cooperative  Airway and Oxygen Therapy: Patient Spontanous Breathing  Post-op Pain: mild  Post-op Assessment: Post-op Vital signs reviewed, Patient's Cardiovascular Status Stable, Respiratory Function Stable, Patent Airway, No signs of Nausea or vomiting and Pain level controlled              Post-op Vital Signs: Reviewed and stable  Last Vitals:  Filed Vitals:   12/23/14 0834  BP: 158/74  Pulse:   Temp:   Resp: 18    Complications: No apparent anesthesia complications

## 2014-12-23 NOTE — Op Note (Addendum)
[facility] [address] [place], [address]   ENDOSCOPY PROCEDURE REPORT  PATIENT: Savannah, Ortiz  MR#: [id number] BIRTHDATE: 1976/06/09 , 39  yrs. old GENDER: female  ENDOSCOPIST: West Bali, MD REFFERED [place]:[id number] Hyacinth Meeker, M.D.  PROCEDURE DATE:  January 05, 2015 PROCEDURE:   EGD with biopsy and EGD with dilatation over guidewire   INDICATIONS:1.  dysphagia.   2.  chest pain. MEDICATIONS: Monitored anesthesia care TOPICAL ANESTHETIC: Viscous Xylocaine  DESCRIPTION OF PROCEDURE:   After the risks benefits and alternatives of the procedure were thoroughly explained, informed consent was obtained.  The     endoscope was introduced through the mouth and advanced to the second portion of the duodenum. The instrument was slowly withdrawn as the mucosa was carefully examined.  Prior to withdrawal of the scope, the guidwire was placed.  The esophagus was dilated successfully.  The patient was recovered in endoscopy and discharged home in satisfactory condition. Estimated blood loss is zero unless otherwise noted in this procedure report.   ESOPHAGUS: MULTIPLE LINEAR EROSIONS IN DISTAL ESOPHAGUS.  PATENT PEPTIC STRICTURE.   STOMACH: Mild erosive gastritis (inflammation) was found in the gastric antrum and gastric body.  Multiple biopsies were performed using cold forceps.   DUODENUM: Mild duodenal inflammation was found in the duodenal bulb.   The duodenal mucosa showed no abnormalities in the 2nd part of the duodenum.   Dilation was then performed at the gastroesphageal junction Dilator: Savary over guidewire Size(s): 14-16 MM Resistance: minimal Heme: yes: TRACE  COMPLICATIONS: There were no immediate complications.  ENDOSCOPIC IMPRESSION: 1.   MODERATE DOSTAL ESOPHAGITIS. 2.   MILD Erosive gastritis AND DUODENITIS  RECOMMENDATIONS: CONTINUE WEIGHT LOSS EFFORTS.  LOSE 10 LBS. AVOID FOOD THAT TRIGGERS REFLUX. FOLLOW A LOW FAT DIET.  MEATS SHOULD BE  BAKED, BROILED, OR BOILED. CONTINUE PROTONIX 30 MINUTES PRIOR TO MEALS BID. VISCOUS LIDOCAINE QAC AND HS AWAIT BIOPSY RESULTS. FOLLOW UP IN 3 MOS.   eSigned:  West Bali, MD 05-Jan-2015 9:37 AM  CPT CODES: ICD CODES:  The ICD and CPT codes recommended by this software are interpretations from the data that the clinical staff has captured with the software.  The verification of the translation of this report to the ICD and CPT codes and modifiers is the sole responsibility of the health care institution and practicing physician where this report was generated.  [employer] will not be held responsible for the validity of the ICD and CPT codes included on this report.  AMA assumes no liability for data contained or not contained herein. CPT is a Publishing rights manager of the [employer].

## 2014-12-25 ENCOUNTER — Encounter (HOSPITAL_COMMUNITY): Payer: Self-pay | Admitting: Gastroenterology

## 2015-01-05 ENCOUNTER — Telehealth: Payer: Self-pay | Admitting: Physician Assistant

## 2015-01-05 ENCOUNTER — Telehealth: Payer: Self-pay | Admitting: Gastroenterology

## 2015-01-05 NOTE — Telephone Encounter (Signed)
TC to [facility], the 6/2 refill on Lorazapam was on hold, it had been filled but not picked up so it was put back. Informed pt they will have it ready for her to pickup

## 2015-01-05 NOTE — Discharge Instructions (Signed)
I dilated your esophagus. You have a stricture near the base of your esophagus.  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 I biopsied your stomach.   CONTINUE YOUR WEIGHT LOSS EFFORTS. LOSE 10 LBS.  AVOID FOOD THAT TRIGGERS REFLUX. SEE INFO BELOW.   FOLLOW A LOW FAT DIET. MEATS SHOULD BE BAKED, BROILED, OR BOILED.SEE INFO BELOW.  CONTINUE PROTONIX. TAKE 30 MINUTES PRIOR TO MEALS TWICE DAILY.  USE VISCOUS LIDOCAINE 2 TSP PRIOR TO MEALS AND AT BEDTIMETO HELP CONTROL FOR FLARES OF HEARTBURN, CHEST PAIN, OR UPPER ABDOMINAL PAIN  USE PEPCID OR ZANTAC AS NEEDED FOR HEARTBURN.  YOUR BIOPSY RESULTS WILL BE AVAILABLE IN MY CHART AFTER   JUN 16 AND MY OFFICE WILL CONTACT YOU IN 10-14 DAYS WITH YOUR RESULTS.   FOLLOW UP IN 3 MOS.  UPPER ENDOSCOPY AFTER CARE Read the instructions outlined below and refer to this sheet in the next week. These discharge instructions provide you with general information on caring for yourself after you leave the hospital. While your treatment has been planned according to the most current medical practices available, unavoidable complications occasionally occur. If you have any problems or questions after discharge, call DR. Marwan Lipe, [phone].  ACTIVITY  You may resume your regular activity, but move at a slower pace for the next 24 hours.   Take frequent rest periods for the next 24 hours.   Walking will help get rid of the air and reduce the bloated feeling in your belly (abdomen).   No driving for 24 hours (because of the medicine (anesthesia) used during the test).   You may shower.   Do not sign any important legal documents or operate any machinery for 24 hours (because of the anesthesia used during the test).    NUTRITION  Drink plenty of fluids.   You may resume your normal diet as instructed by your doctor.   Begin with a light meal and progress to your normal diet. Heavy or fried foods are harder to digest and may make you feel sick  to your stomach (nauseated).   Avoid alcoholic beverages for 24 hours or as instructed.    MEDICATIONS  You may resume your normal medications.   WHAT YOU CAN EXPECT TODAY  Some feelings of bloating in the abdomen.   Passage of more gas than usual.    IF YOU HAD A BIOPSY TAKEN DURING THE UPPER ENDOSCOPY:  Eat a soft diet IF YOU HAVE NAUSEA, BLOATING, ABDOMINAL PAIN, OR VOMITING.    FINDING OUT THE RESULTS OF YOUR TEST Not all test results are available during your visit. DR. Darrick Penna WILL CALL YOU WITHIN 14 DAYS OF YOUR PROCEDUE WITH YOUR RESULTS. Do not assume everything is normal if you have not heard from DR. Quayshaun Hubbert, CALL HER OFFICE AT [phone].  SEEK IMMEDIATE MEDICAL ATTENTION AND CALL THE OFFICE: [phone] IF:  You have more than a spotting of blood in your stool.   Your belly is swollen (abdominal distention).   You are nauseated or vomiting.   You have a temperature over 101F.   You have abdominal pain or discomfort that is severe or gets worse throughout the day.  Gastritis  Gastritis is an inflammation (the body's way of reacting to injury and/or infection) of the stomach. It is often caused by viral or bacterial (germ) infections. It can also be caused BY ASPIRIN, BC/GOODY POWDER'S, (IBUPROFEN) MOTRIN, OR ALEVE (NAPROXEN), chemicals (including alcohol), SPICY FOODS, and medications. This illness may be associated with generalized malaise (  feeling tired, not well), UPPER ABDOMINAL STOMACH cramps, and fever. One common bacterial cause of gastritis is an organism known as H. Pylori. This can be treated with antibiotics.    Duodenitis Duodenitis is inflammation of the lining of the first part of your small intestine (duodenum). There are two types of duodenitis:  Acute duodenitis (develops suddenly and is short lived).   Chronic duodenitis (develops over an extended period and lasts months to years). CAUSES  Duodenitis is most often caused by infection  with the bacterium Helicobacter pylori (H. pylori). H. pylori increases the production of stomach acid and causes changes in the environment of the duodenum. This irritates and damages the cells of the duodenum causing inflammation. Other causes of duodenitis include:   Long-term use of nonsteroidal anti-inflammatory drugs (NSAIDs). NSAIDs change the lining of the duodenum and make it more prone to injury from stomach acid.  Excessive use of alcohol. Alcohol increases stomach acid and changes the lining of the duodenum which makes it more likely for inflammation to develop.  Giardiasis. Giardiasis is a common infection of the small intestine. It can cause inflammation of the duodenum.   Other gastrointestinal disorders, such as Crohn disease. People with these disorders are more likely to develop duodenitis. SYMPTOMS  Although duodenitis does not always cause symptoms, symptoms that do occur include:  Nausea or vomiting.  Gassy, bloated feeling or an uncomfortable feeling of fullness after eating.  Burning, cramps, or pain in the upper abdominal area. DIAGNOSIS  To diagnose duodenitis, your health care provider may use results from:   An exam of the duodenum using a thin tube with a tiny camera on the tip, which is placed down your throat (endoscope). The endoscope is passed through your stomach and into your duodenum. Sometimes a sample of tissue from your duodenum is removed with the endoscope. The sample is then examined under a microscope (biopsy) for signs of inflammation and H. pylori infection.   Tests that check samples of your blood or stool for H. pylori infection.   A test that checks the gases in a sample of your expired breath for H. pylori infection. The test measures the levels of carbon dioxide in your breath after you drink a special solution.  An X-ray exam using a special liquid that you swallow to illuminate your digestive tract (barium) to show signs of  inflammation. TREATMENT  Treatment will depend on the cause of the duodenitis. The most common treatments include:  Use of medication to treat infection.  Medication to reduce stomach acid.  Discontinuing the use of NSAIDs.  Management of other gastrointestinal conditions.  Avoiding alcohol consumption. Additionally, taking the following steps can help to reduce the severity of your symptoms:  Drink enough water to keep your urine clear or pale yellow.  Avoid consuming these foods or drinks:  Caffeinated drinks.  Chocolate.  Peppermint or mint-flavored food or drinks.  Garlic.  Onions.  Spicy foods.  Citrus fruits, such as oranges, lemons, or limes.  Foods that use tomato-based sauces, such as pasta sauce, chili, salsa, and pizza.  Fatty foods.  Fried foods. Document Released: 10/22/2012 Document Revised: 11/11/2013 Document Reviewed: 10/22/2012 [facility] Patient Information 2015 [place], [place]. This information is not intended to replace advice given to you by your health care provider. Make sure you discuss any questions you have with your health care provider.  ESOPHAGEAL STRICTURE  Esophageal strictures can be caused by stomach acid backing up into the tube that carries food from the  mouth down to the stomach (lower esophagus).  TREATMENT There are a number of  medicines used to treat reflux/stricture, including: Antacids.  Proton-pump inhibitors: PROTONIX  HOME CARE INSTRUCTIONS Eat 2-3 hours before going to bed.  Try to reach and maintain a healthy weight.  Do not eat just a few very large meals. Instead, eat 4 TO 6 smaller meals throughout the day.  Try to identify foods and beverages that make your symptoms worse, and avoid these.  Avoid tight clothing.  Do not exercise right after eating.   Lifestyle and home remedies TO MANAGE REFLUX/CHEST PAIN  You may eliminate or reduce the frequency of heartburn by making the following lifestyle  changes:   Control your weight. Being overweight is a major risk factor for heartburn and GERD. Excess pounds put pressure on your abdomen, pushing up your stomach and causing acid to back up into your esophagus.    Eat smaller meals. 4 TO 6 MEALS A DAY. This reduces pressure on the lower esophageal sphincter, helping to prevent the valve from opening and acid from washing back into your esophagus.    Loosen your belt. Clothes that fit tightly around your waist put pressure on your abdomen and the lower esophageal sphincter.    Eliminate heartburn/REFLUX triggers. Everyone has specific triggers. Common triggers such as fatty or fried foods, spicy food, tomato sauce, carbonated beverages, alcohol, chocolate, mint, garlic, onion, caffeine and nicotine may make heartburn worse.    Avoid stooping or bending. Tying your shoes is OK. Bending over for longer periods to weed your garden isn't, especially soon after eating.    Don't lie down after a meal. Wait at least three to four hours after eating before going to bed, and don't lie down right after eating.    PUT THE HEAD OF YOUR BED ON 6 INCH BLOCKS.   Alternative medicine  Several home remedies exist for treating GERD, but they provide only temporary relief. They include drinking baking soda (sodium bicarbonate) added to water or drinking other fluids such as baking soda mixed with cream of tartar and water.   Although these liquids create temporary relief by neutralizing, washing away or buffering acids, eventually they aggravate the situation by adding gas and fluid to your stomach, increasing pressure and causing more acid reflux. Further, adding more sodium to your diet may increase your blood pressure and add stress to your heart, and excessive bicarbonate ingestion can alter the acid-base balance in your body.  Biopsy Care After Refer to this sheet in the next few weeks. These instructions provide you with information on caring  for yourself after your procedure. Your caregiver may also give you more specific instructions. Your treatment has been planned according to current medical practices, but problems sometimes occur. Call your caregiver if you have any problems or questions after your procedure. If you had a fine needle biopsy, you may have soreness at the biopsy site for 1 to 2 days. If you had an open biopsy, you may have soreness at the biopsy site for 3 to 4 days. HOME CARE INSTRUCTIONS   You may resume normal diet and activities as directed.  Change bandages (dressings) as directed. If your wound was closed with a skin glue (adhesive), it will wear off and begin to peel in 7 days.  Only take over-the-counter or prescription medicines for pain, discomfort, or fever as directed by your caregiver.  Ask your caregiver when you can bathe and get your wound wet. SEEK IMMEDIATE  MEDICAL CARE IF:   You have increased bleeding (more than a small spot) from the biopsy site.  You notice redness, swelling, or increasing pain at the biopsy site.  You have pus coming from the biopsy site.  You have a fever.  You notice a bad smell coming from the biopsy site or dressing.  You have a rash, have difficulty breathing, or have any allergic problems. MAKE SURE YOU:   Understand these instructions.  Will watch your condition.  Will get help right away if you are not doing well or get worse. Document Released: 01/14/2005 Document Revised: 09/19/2011 Document Reviewed: 12/23/2010 [facility] Patient Information 2015 [place]. This information is not intended to replace advice given to you by your health care provider. Make sure you discuss any questions you have with your health care provider.   Low-Fat Diet BREADS, CEREALS, PASTA, RICE, DRIED PEAS, AND BEANS These products are high in carbohydrates and most are low in fat. Therefore, they can be increased in the diet as substitutes for fatty foods. They too,  however, contain calories and should not be eaten in excess. Cereals can be eaten for snacks as well as for breakfast.   FRUITS AND VEGETABLES It is good to eat fruits and vegetables. Besides being sources of fiber, both are rich in vitamins and some minerals. They help you get the daily allowances of these nutrients. Fruits and vegetables can be used for snacks and desserts.  MEATS Limit lean meat, chicken, [place], and fish to no more than 6 ounces per day. Beef, Pork, and Lamb Use lean cuts of beef, pork, and lamb. Lean cuts include:  Extra-lean ground beef.  Arm roast.  Sirloin tip.  Center-cut ham.  Round steak.  Loin chops.  Rump roast.  Tenderloin.  Trim all fat off the outside of meats before cooking. It is not necessary to severely decrease the intake of red meat, but lean choices should be made. Lean meat is rich in protein and contains a highly absorbable form of iron. Premenopausal women, in particular, should avoid reducing lean red meat because this could increase the risk for low red blood cells (iron-deficiency anemia).  Chicken and [place] These are good sources of protein. The fat of poultry can be reduced by removing the skin and underlying fat layers before cooking. Chicken and [place] can be substituted for lean red meat in the diet. Poultry should not be fried or covered with high-fat sauces. Fish and Shellfish Fish is a good source of protein. Shellfish contain cholesterol, but they usually are low in saturated fatty acids. The preparation of fish is important. Like chicken and [place], they should not be fried or covered with high-fat sauces. EGGS Egg whites contain no fat or cholesterol. They can be eaten often. Try 1 to 2 egg whites instead of whole eggs in recipes or use egg substitutes that do not contain yolk. MILK AND DAIRY PRODUCTS Use skim or 1% milk instead of 2% or whole milk. Decrease whole milk, natural, and processed cheeses. Use nonfat or low-fat (2%)  cottage cheese or low-fat cheeses made from vegetable oils. Choose nonfat or low-fat (1 to 2%) yogurt. Experiment with evaporated skim milk in recipes that call for heavy cream. Substitute low-fat yogurt or low-fat cottage cheese for sour cream in dips and salad dressings. Have at least 2 servings of low-fat dairy products, such as 2 glasses of skim (or 1%) milk each day to help get your daily calcium intake. FATS AND OILS Reduce  the total intake of fats, especially saturated fat. Butterfat, lard, and beef fats are high in saturated fat and cholesterol. These should be avoided as much as possible. Vegetable fats do not contain cholesterol, but certain vegetable fats, such as coconut oil, palm oil, and palm kernel oil are very high in saturated fats. These should be limited. These fats are often used in bakery goods, processed foods, popcorn, oils, and nondairy creamers. Vegetable shortenings and some peanut butters contain hydrogenated oils, which are also saturated fats. Read the labels on these foods and check for saturated vegetable oils. Unsaturated vegetable oils and fats do not raise blood cholesterol. However, they should be limited because they are fats and are high in calories. Total fat should still be limited to 30% of your daily caloric intake. Desirable liquid vegetable oils are corn oil, cottonseed oil, olive oil, canola oil, safflower oil, soybean oil, and sunflower oil. Peanut oil is not as good, but small amounts are acceptable. Buy a heart-healthy tub margarine that has no partially hydrogenated oils in the ingredients. Mayonnaise and salad dressings often are made from unsaturated fats, but they should also be limited because of their high calorie and fat content. Seeds, nuts, peanut butter, olives, and avocados are high in fat, but the fat is mainly the unsaturated type. These foods should be limited mainly to avoid excess calories and fat. OTHER EATING TIPS Snacks  Most sweets should be  limited as snacks. They tend to be rich in calories and fats, and their caloric content outweighs their nutritional value. Some good choices in snacks are graham crackers, melba toast, soda crackers, bagels (no egg), English muffins, fruits, and vegetables. These snacks are preferable to snack crackers, [place] fries, TORTILLA CHIPS, and POTATO chips. Popcorn should be air-popped or cooked in small amounts of liquid vegetable oil. Desserts Eat fruit, low-fat yogurt, and fruit ices instead of pastries, cake, and cookies. Sherbet, angel food cake, gelatin dessert, frozen low-fat yogurt, or other frozen products that do not contain saturated fat (pure fruit juice bars, frozen ice pops) are also acceptable.  COOKING METHODS Choose those methods that use little or no fat. They include: Poaching.  Braising.  Steaming.  Grilling.  Baking.  Stir-frying.  Broiling.  Microwaving.  Foods can be cooked in a nonstick pan without added fat, or use a nonfat cooking spray in regular cookware. Limit fried foods and avoid frying in saturated fat. Add moisture to lean meats by using water, broth, cooking wines, and other nonfat or low-fat sauces along with the cooking methods mentioned above. Soups and stews should be chilled after cooking. The fat that forms on top after a few hours in the refrigerator should be skimmed off. When preparing meals, avoid using excess salt. Salt can contribute to raising blood pressure in some people.  EATING AWAY FROM HOME Order entres, potatoes, and vegetables without sauces or butter. When meat exceeds the size of a deck of cards (3 to 4 ounces), the rest can be taken home for another meal. Choose vegetable or fruit salads and ask for low-calorie salad dressings to be served on the side. Use dressings sparingly. Limit high-fat toppings, such as bacon, crumbled eggs, cheese, sunflower seeds, and olives. Ask for heart-healthy tub margarine instead of butter.

## 2015-01-05 NOTE — Telephone Encounter (Signed)
Please call pt. HER stomach Bx shows gastritis.    CONTINUE YOUR WEIGHT LOSS EFFORTS. LOSE 10 LBS.  AVOID FOOD THAT TRIGGERS REFLUX.   FOLLOW A LOW FAT DIET. MEATS SHOULD BE BAKED, BROILED, OR BOILED.  CONTINUE PROTONIX. TAKE 30 MINUTES PRIOR TO MEALS TWICE DAILY.  USE VISCOUS LIDOCAINE 2 TSP PRIOR TO MEALS AND AT BEDTIMETO HELP CONTROL FOR FLARES OF HEARTBURN, CHEST PAIN, OR UPPER ABDOMINAL PAIN  USE PEPCID OR ZANTAC AS NEEDED FOR HEARTBURN.  FOLLOW UP IN 3 MOS E30 GASTRITIS/ESOPHAGITIS/DYSPHAGIA.

## 2015-01-06 MED ORDER — DEXLANSOPRAZOLE 60 MG PO CPDR: DELAYED_RELEASE_CAPSULE | ORAL | Status: DC

## 2015-01-06 NOTE — Telephone Encounter (Signed)
Pt is aware of results but the Protonix and zantac is not working. She is having to use the lidocaine all the time. She is also asking if we can write a letter for her that she can get her bottom teeth since she lost them in the movie. Please advise

## 2015-01-06 NOTE — Addendum Note (Signed)
Addended by: West BaliFIELDS, Brevan Luberto L on: 01/06/2015 03:15 PM   Modules accepted: Orders

## 2015-01-06 NOTE — Telephone Encounter (Signed)
PLEASE CALL PT. She can stop Protonix and start Dexilant. She has tried and failed many other drugs in this category. She should continue VISCOUS LIDOCAINE. SHE NEEDS TO SEE HER PCP ABOUT HER BOTTOM TEETH.

## 2015-01-06 NOTE — Telephone Encounter (Signed)
Pt is aware of results. 

## 2015-01-07 NOTE — Telephone Encounter (Signed)
Pt has appt 04/10/15 with Savannah HallsAnna Sams. Appt letter mailed

## 2015-01-13 ENCOUNTER — Telehealth: Payer: Self-pay | Admitting: Physician Assistant

## 2015-01-13 NOTE — Telephone Encounter (Signed)
Last filled on 12/23/14 for #90. If approved print and route to nurse to call for pickup.

## 2015-01-14 NOTE — Telephone Encounter (Signed)
Tramadol was only filled because of this last note:    Savannah MeekerMiller referred Savannah Savannah KatzPatel Savannah Ortiz referred to a different specialist -- appt is at end of JUne.  Needs pain meds until then - the most she has had is IBU 800  She should have had her appt by now and I was only giving her pain med until she could see the specialist.   Savannah A. Chauncey ReadingGann PA-C

## 2015-01-14 NOTE — Telephone Encounter (Signed)
Pt notified needs to get refill of Tramadol from specialist

## 2015-01-16 ENCOUNTER — Ambulatory Visit: Payer: Medicaid Other | Admitting: Physician Assistant

## 2015-01-23 ENCOUNTER — Encounter: Payer: Self-pay | Admitting: Physician Assistant

## 2015-03-14 ENCOUNTER — Other Ambulatory Visit: Payer: Self-pay | Admitting: Physician Assistant

## 2015-03-17 NOTE — Telephone Encounter (Signed)
Last filled 01/05/15, last seen 11/07/14. Route to pool, nurse call in at [facility]

## 2015-03-17 NOTE — Telephone Encounter (Signed)
Approved. Please call in  [facility] A. Alvie Fowles PA-C   

## 2015-03-17 NOTE — Telephone Encounter (Signed)
Lorazepam called to the voice mail at [facility] per T. Chauncey Reading.

## 2015-03-18 ENCOUNTER — Telehealth: Payer: Self-pay | Admitting: *Deleted

## 2015-03-18 NOTE — Telephone Encounter (Signed)
Called patient back and left message for her to call up here to set up a follow up appointment with Dr. Allena Katz to discuss options.

## 2015-03-18 NOTE — Telephone Encounter (Signed)
Patient called to let us know that she is still having bad back pain.  She is taking gabapentin 600 mg tid.  Please advise.

## 2015-03-18 NOTE — Telephone Encounter (Signed)
She was supposed to see pain management - notes indicate Dr. Murray Hodgkins - did she see him?  I last saw her in December 2015.  She needs a follow-up appointment to discuss options.

## 2015-03-19 ENCOUNTER — Encounter: Payer: Self-pay | Admitting: Physician Assistant

## 2015-03-19 ENCOUNTER — Ambulatory Visit (INDEPENDENT_AMBULATORY_CARE_PROVIDER_SITE_OTHER): Payer: Medicaid Other | Admitting: Physician Assistant

## 2015-03-19 VITALS — BP 128/83 | HR 84 | Temp 98.0°F | Ht 68.0 in | Wt 232.0 lb

## 2015-03-19 DIAGNOSIS — G8929 Other chronic pain: Secondary | ICD-10-CM | POA: Diagnosis not present

## 2015-03-19 DIAGNOSIS — F411 Generalized anxiety disorder: Secondary | ICD-10-CM

## 2015-03-19 MED ORDER — DULOXETINE HCL 30 MG PO CPEP: ORAL_CAPSULE | ORAL | Status: DC

## 2015-03-19 NOTE — Progress Notes (Signed)
   Subjective:    Patient ID: Savannah Ortiz, female    DOB: 1976-01-25, 39 y.o.   MRN: [id number]  HPI 39 y/o female presents for follow up today. She is needing a partial plate for teeth and was told by Medicaid that she would need a letter of need prior to approval She has chronic dysphasia.   She also saw Dr. Lennon Alstrom, spine specialist. She is trying to undergo pain mgmt but he won't because she is taking ativan. She states that she can't take it any longer anyway because she has "black out spells" and does remember what happens. Dr. Lennon Alstrom suggested Cymbalta for anxiety and pain mgmt, stopping the Ativan.     Review of Systems  Constitutional: Negative.   HENT: Positive for dental problem.   Eyes: Negative.   Respiratory: Negative.   Cardiovascular: Negative.   Gastrointestinal:       Esophageal dysphasia   Musculoskeletal: Positive for myalgias and back pain.  Psychiatric/Behavioral: Positive for sleep disturbance. The patient is nervous/anxious.        Objective:   Physical Exam  Constitutional: She is oriented to person, place, and time. She appears well-developed and well-nourished. No distress.  HENT:  Head: Normocephalic.  Missing and broken teeth   Cardiovascular: Normal rate, regular rhythm and normal heart sounds.  Exam reveals no gallop and no friction rub.   No murmur heard. Pulmonary/Chest: Effort normal and breath sounds normal.  Neurological: She is alert and oriented to person, place, and time.  Skin: She is not diaphoretic.  Psychiatric: Judgment and thought content normal.  Appears very anxious during exam  Vitals reviewed.         Assessment & Plan:  1. Generalized anxiety disorder  - DULoxetine (CYMBALTA) 30 MG capsule; 2 capsules PO q day  Dispense: 60 capsule; Refill: 3  2. Chronic pain  - DULoxetine (CYMBALTA) 30 MG capsule; 2 capsules PO q day  Dispense: 60 capsule; Refill: 3   Continue all meds Labs pending Health Maintenance  reviewed Diet and exercise encouraged RTO 6 weeks   Dalexa Gentz A. Chauncey Reading PA-C

## 2015-03-19 NOTE — Patient Instructions (Signed)
Start Cymbalta - one pill daily x 1 week, then increase to 2 pills daily on week 2

## 2015-04-10 ENCOUNTER — Ambulatory Visit: Payer: Medicaid Other | Admitting: Gastroenterology

## 2015-04-10 ENCOUNTER — Telehealth: Payer: Self-pay | Admitting: Gastroenterology

## 2015-04-10 ENCOUNTER — Encounter: Payer: Self-pay | Admitting: Gastroenterology

## 2015-04-10 NOTE — Telephone Encounter (Signed)
PATIENT WAS A NO SHOW AND LETTER SENT  °

## 2015-05-01 ENCOUNTER — Ambulatory Visit: Payer: Medicaid Other | Admitting: Pediatrics

## 2015-05-05 ENCOUNTER — Encounter: Payer: Self-pay | Admitting: Family Medicine

## 2015-05-05 ENCOUNTER — Ambulatory Visit (INDEPENDENT_AMBULATORY_CARE_PROVIDER_SITE_OTHER): Payer: Medicaid Other | Admitting: Family Medicine

## 2015-05-05 ENCOUNTER — Ambulatory Visit: Payer: Medicaid Other | Admitting: Pediatrics

## 2015-05-05 VITALS — BP 132/79 | HR 75 | Temp 98.0°F | Ht 68.0 in | Wt 244.0 lb

## 2015-05-05 DIAGNOSIS — F411 Generalized anxiety disorder: Secondary | ICD-10-CM | POA: Diagnosis not present

## 2015-05-05 DIAGNOSIS — I10 Essential (primary) hypertension: Secondary | ICD-10-CM | POA: Insufficient documentation

## 2015-05-05 DIAGNOSIS — R232 Flushing: Secondary | ICD-10-CM

## 2015-05-05 MED ORDER — ESCITALOPRAM OXALATE 10 MG PO TABS
10.0000 mg | ORAL_TABLET | Freq: Every day | ORAL | Status: DC
Start: 2015-05-05 — End: 2015-06-01

## 2015-05-05 NOTE — Patient Instructions (Addendum)
Check blood pressure several times a week and record the readings on the log sheet given and bring those with you for your return visit.  DASH Eating Plan DASH stands for "Dietary Approaches to Stop Hypertension." The DASH eating plan is a healthy eating plan that has been shown to reduce high blood pressure (hypertension). Additional health benefits may include reducing the risk of type 2 diabetes mellitus, heart disease, and stroke. The DASH eating plan may also help with weight loss. WHAT DO I NEED TO KNOW ABOUT THE DASH EATING PLAN? For the DASH eating plan, you will follow these general guidelines:  Choose foods with a percent daily value for sodium of less than 5% (as listed on the food label).  Use salt-free seasonings or herbs instead of table salt or sea salt.  Check with your health care provider or pharmacist before using salt substitutes.  Eat lower-sodium products, often labeled as "lower sodium" or "no salt added."  Eat fresh foods.  Eat more vegetables, fruits, and low-fat dairy products.  Choose whole grains. Look for the word "whole" as the first word in the ingredient list.  Choose fish and skinless chicken or [place] more often than red meat. Limit fish, poultry, and meat to 6 oz (170 g) each day.  Limit sweets, desserts, sugars, and sugary drinks.  Choose heart-healthy fats.  Limit cheese to 1 oz (28 g) per day.  Eat more home-cooked food and less restaurant, buffet, and fast food.  Limit fried foods.  Cook foods using methods other than frying.  Limit canned vegetables. If you do use them, rinse them well to decrease the sodium.  When eating at a restaurant, ask that your food be prepared with less salt, or no salt if possible. WHAT FOODS CAN I EAT? Seek help from a dietitian for individual calorie needs. Grains Whole grain or whole wheat bread. Brown rice. Whole grain or whole wheat pasta. Quinoa, bulgur, and whole grain cereals. Low-sodium cereals. Corn  or whole wheat flour tortillas. Whole grain cornbread. Whole grain crackers. Low-sodium crackers. Vegetables Fresh or frozen vegetables (raw, steamed, roasted, or grilled). Low-sodium or reduced-sodium tomato and vegetable juices. Low-sodium or reduced-sodium tomato sauce and paste. Low-sodium or reduced-sodium canned vegetables.  Fruits All fresh, canned (in natural juice), or frozen fruits. Meat and Other Protein Products Ground beef (85% or leaner), grass-fed beef, or beef trimmed of fat. Skinless chicken or [place]key. Ground chicken or [place]key. Pork trimmed of fat. All fish and seafood. Eggs. Dried beans, peas, or lentils. Unsalted nuts and seeds. Unsalted canned beans. Dairy Low-fat dairy products, such as skim or 1% milk, 2% or reduced-fat cheeses, low-fat ricotta or cottage cheese, or plain low-fat yogurt. Low-sodium or reduced-sodium cheeses. Fats and Oils Tub margarines without trans fats. Light or reduced-fat mayonnaise and salad dressings (reduced sodium). Avocado. Safflower, olive, or canola oils. Natural peanut or almond butter. Other Unsalted popcorn and pretzels. The items listed above may not be a complete list of recommended foods or beverages. Contact your dietitian for more options. WHAT FOODS ARE NOT RECOMMENDED? Grains White bread. White pasta. White rice. Refined cornbread. Bagels and croissants. Crackers that contain trans fat. Vegetables Creamed or fried vegetables. Vegetables in a cheese sauce. Regular canned vegetables. Regular canned tomato sauce and paste. Regular tomato and vegetable juices. Fruits Dried fruits. Canned fruit in light or heavy syrup. Fruit juice. Meat and Other Protein Products Fatty cuts of meat. Ribs, chicken wings, bacon, sausage, bologna, salami, chitterlings, fatback, hot dogs, bratwurst, and  packaged luncheon meats. Salted nuts and seeds. Canned beans with salt. Dairy Whole or 2% milk, cream, half-and-half, and cream cheese. Whole-fat or  sweetened yogurt. Full-fat cheeses or blue cheese. Nondairy creamers and whipped toppings. Processed cheese, cheese spreads, or cheese curds. Condiments Onion and garlic salt, seasoned salt, table salt, and sea salt. Canned and packaged gravies. Worcestershire sauce. Tartar sauce. Barbecue sauce. Teriyaki sauce. Soy sauce, including reduced sodium. Steak sauce. Fish sauce. Oyster sauce. Cocktail sauce. Horseradish. Ketchup and mustard. Meat flavorings and tenderizers. Bouillon cubes. Hot sauce. Tabasco sauce. Marinades. Taco seasonings. Relishes. Fats and Oils Butter, stick margarine, lard, shortening, ghee, and bacon fat. Coconut, palm kernel, or palm oils. Regular salad dressings. Other Pickles and olives. Salted popcorn and pretzels. The items listed above may not be a complete list of foods and beverages to avoid. Contact your dietitian for more information. WHERE CAN I FIND MORE INFORMATION? National Heart, Lung, and Blood Institute: travelstabloid.com   This information is not intended to replace advice given to you by your health care provider. Make sure you discuss any questions you have with your health care provider.   Document Released: 06/16/2011 Document Revised: 07/18/2014 Document Reviewed: 05/01/2013 Elsevier Interactive Patient Education [employer].

## 2015-05-05 NOTE — Progress Notes (Signed)
Subjective:  Patient ID: Savannah Ortiz, female    DOB: 27-Dec-1975  Age: 39 y.o. MRN: [id number]  CC: GAD   HPI Savannah Ortiz presents for treatment of anxiety. Doesn't like to be enclosed. Easily irritated.Not happy. A lot of stuff going on. Can't deal with it. Dizzy headed. Cymbalta didn't help. Took 30 mg BID. GAD Attached, reviewed. Score 18.  Has taken BP meds in the past. 741 systolic in the past. Wakes uo in sweats. Skin turns red feels dry. Doesn't sleep much.  History Savannah Ortiz has a past medical history of Insomnia; Anxiety; Allergic rhinitis; Reactive airway disease; Depression; Bronchitis; Sinusitis; Exposure to hepatitis C; and GERD (gastroesophageal reflux disease).   She has past surgical history that includes reconstructive for feet and leg (Bilateral, 1985); Cholecystectomy (1997); Carpal tunnel release (Bilateral, 2010 & 2011); Knee arthroscopy (Right); Esophagogastroduodenoscopy (N/A, 12/09/2014); Esophagogastroduodenoscopy (egd) with propofol (N/A, 12/23/2014); Esophageal dilation (N/A, 12/23/2014); and esophageal biopsy (N/A, 12/23/2014).   Her family history includes Cancer in her maternal grandmother, paternal grandfather, and sister; Diabetes in her maternal grandmother; Healthy in her daughter and son; Ovarian cancer in her mother; Thyroid cancer in her father. There is no history of Colon cancer.She reports that she has never smoked. She does not have any smokeless tobacco history on file. She reports that she does not drink alcohol or use illicit drugs.  Outpatient Prescriptions Prior to Visit  Medication Sig Dispense Refill  . gabapentin (NEURONTIN) 300 MG capsule Take 1 capsule (300 mg total) by mouth 3 (three) times daily. 1 capsule on day 1, BID on day 2, increase to TID on day 3 90 capsule 1  . MIRENA 20 MCG/24HR IUD   0  . pantoprazole (PROTONIX) 40 MG tablet 1 PO 30 MINUTES PRIOR TO MEALS BID FOR 3 MOS THEN QD 60 tablet 11  . DULoxetine (CYMBALTA) 30 MG  capsule 2 capsules PO q day (Patient not taking: Reported on 05/05/2015) 60 capsule 3  . dexlansoprazole (DEXILANT) 60 MG capsule 1 PO EVERY MORNING WITH BREAKFAST. (Patient not taking: Reported on 03/19/2015) 30 capsule 11  . lidocaine (XYLOCAINE) 2 % solution 2 TSP  PO QAC AND HS TO PREVENT FOR CHEST PAIN/UPPER ABDOMINAL PAIN (Patient not taking: Reported on 05/05/2015) 500 mL 1   No facility-administered medications prior to visit.    ROS Review of Systems  Constitutional: Negative for fever, activity change and appetite change.  HENT: Negative for congestion, rhinorrhea and sore throat.   Eyes: Negative for pain and visual disturbance.  Respiratory: Negative for cough and shortness of breath.   Gastrointestinal: Negative for nausea and abdominal pain.  Musculoskeletal: Negative for myalgias and arthralgias.  Psychiatric/Behavioral: Positive for sleep disturbance, dysphoric mood, decreased concentration and agitation. The patient is nervous/anxious.     Objective:  BP 132/79 mmHg  Pulse 75  Temp(Src) 98 F (36.7 C) (Oral)  Ht _0  (1.727 m)  Wt 244 lb (110.678 kg)  BMI 37.11 kg/m2  BP Readings from Last 3 Encounters:  05/05/15 132/79  03/19/15 128/83  12/23/14 103/60    Wt Readings from Last 3 Encounters:  05/05/15 244 lb (110.678 kg)  03/19/15 232 lb (105.235 kg)  12/23/14 238 lb (107.956 kg)     Physical Exam  Constitutional: She is oriented to person, place, and time. She appears well-developed and well-nourished. No distress.  HENT:  Head: Normocephalic and atraumatic.  Right Ear: External ear normal.  Left Ear: External ear normal.  Nose: Nose normal.  Mouth/Throat:  Oropharynx is clear and moist.  Eyes: Conjunctivae and EOM are normal. Pupils are equal, round, and reactive to light.  Neck: Normal range of motion. Neck supple. No thyromegaly present.  Cardiovascular: Normal rate, regular rhythm and normal heart sounds.   No murmur heard. Pulmonary/Chest:  Effort normal and breath sounds normal. No respiratory distress. She has no wheezes. She has no rales.  Abdominal: Soft. Bowel sounds are normal. She exhibits no distension. There is no tenderness.  Lymphadenopathy:    She has no cervical adenopathy.  Neurological: She is alert and oriented to person, place, and time. She has normal reflexes.  Skin: Skin is warm and dry.  Psychiatric: She has a normal mood and affect. Her behavior is normal. Judgment and thought content normal.    No results found for: HGBA1C  Lab Results  Component Value Date   WBC 11.9* 12/17/2014   HGB 13.8 12/17/2014   HCT 41.8 12/17/2014   PLT 244 12/17/2014   GLUCOSE 118* 12/17/2014   NA 140 12/17/2014   K 4.4 12/17/2014   CL 101 12/17/2014   CREATININE 0.69 12/17/2014   BUN 16 12/17/2014   CO2 30 12/17/2014    No results found.  Assessment & Plan:   Savannah Ortiz was seen today for gad.  Diagnoses and all orders for this visit:  GAD (generalized anxiety disorder)  Flushing -     CBC with Differential/Platelet -     CMP14+EGFR  Essential hypertension -     CBC with Differential/Platelet -     CMP14+EGFR  Other orders -     escitalopram (LEXAPRO) 10 MG tablet; Take 1 tablet (10 mg total) by mouth daily.   I have discontinued Ms. Michelin's lidocaine and dexlansoprazole. I am also having her start on escitalopram. Additionally, I am having her maintain her MIRENA (52 MG), gabapentin, pantoprazole, and DULoxetine.  Meds ordered this encounter  Medications  . escitalopram (LEXAPRO) 10 MG tablet    Sig: Take 1 tablet (10 mg total) by mouth daily.    Dispense:  30 tablet    Refill:  0   This patient is off of blood pressure medication and her reading is good today, instead of starting treatment recommending salt avoidance. She will monitor blood pressure regularly and log those readings and bring them with her to her follow-up.  Follow-up: Return in about 1 month (around 06/05/2015) for Anxiety,  hypertension.  Claretta Fraise, M.D.

## 2015-05-06 ENCOUNTER — Telehealth: Payer: Self-pay | Admitting: Physician Assistant

## 2015-05-06 ENCOUNTER — Other Ambulatory Visit: Payer: Self-pay | Admitting: Physician Assistant

## 2015-05-06 LAB — CMP14+EGFR
A/G RATIO: 1.4 (ref 1.1–2.5)
ALK PHOS: 24 IU/L — AB (ref 39–117)
ALT: 9 IU/L (ref 0–32)
AST: 11 IU/L (ref 0–40)
Albumin: 3.9 g/dL (ref 3.5–5.5)
BUN/Creatinine Ratio: 13 (ref 8–20)
BUN: 9 mg/dL (ref 6–20)
Bilirubin Total: 0.4 mg/dL (ref 0.0–1.2)
CALCIUM: 9.2 mg/dL (ref 8.7–10.2)
CO2: 28 mmol/L (ref 18–29)
Chloride: 103 mmol/L (ref 97–106)
Creatinine, Ser: 0.72 mg/dL (ref 0.57–1.00)
GFR calc Af Amer: 122 mL/min/{1.73_m2} (ref >59)
GFR, EST NON AFRICAN AMERICAN: 106 mL/min/{1.73_m2} (ref >59)
Globulin, Total: 2.7 g/dL (ref 1.5–4.5)
Glucose: 106 mg/dL — ABNORMAL HIGH (ref 65–99)
Potassium: 4.1 mmol/L (ref 3.5–5.2)
Sodium: 145 mmol/L — ABNORMAL HIGH (ref 136–144)
Total Protein: 6.6 g/dL (ref 6.0–8.5)

## 2015-05-06 LAB — CBC WITH DIFFERENTIAL/PLATELET
BASOS: 1 %
Basophils Absolute: 0 10*3/uL (ref 0.0–0.2)
EOS (ABSOLUTE): 0.1 10*3/uL (ref 0.0–0.4)
EOS: 2 %
HEMATOCRIT: 36.5 % (ref 34.0–46.6)
Hemoglobin: 11.8 g/dL (ref 11.1–15.9)
Immature Grans (Abs): 0 10*3/uL (ref 0.0–0.1)
Immature Granulocytes: 0 %
LYMPHS ABS: 1.4 10*3/uL (ref 0.7–3.1)
Lymphs: 26 %
MCH: 30 pg (ref 26.6–33.0)
MCHC: 32.3 g/dL (ref 31.5–35.7)
MCV: 93 fL (ref 79–97)
MONOS ABS: 0.5 10*3/uL (ref 0.1–0.9)
Monocytes: 9 %
Neutrophils Absolute: 3.4 10*3/uL (ref 1.4–7.0)
Neutrophils: 62 %
Platelets: 204 10*3/uL (ref 150–379)
RBC: 3.93 x10E6/uL (ref 3.77–5.28)
RDW: 14.2 % (ref 12.3–15.4)
WBC: 5.5 10*3/uL (ref 3.4–10.8)

## 2015-05-06 NOTE — Telephone Encounter (Signed)
Okay to refill at current level. I do not see where we have prescribed it so please get details from pt or pharmacy. Thanks, WS

## 2015-05-07 MED ORDER — IBUPROFEN 800 MG PO TABS: 800.0000 mg | ORAL_TABLET | Freq: Three times a day (TID) | ORAL | Status: DC | PRN

## 2015-05-07 NOTE — Telephone Encounter (Signed)
Patient was taking 800mg  every 8 hrs PRN.  Last filled for # 90. Sent in script for 800mg  1 po q 8 hrs prn #90 with 1 rf.

## 2015-05-08 ENCOUNTER — Emergency Department (HOSPITAL_COMMUNITY): Payer: Medicaid Other

## 2015-05-08 ENCOUNTER — Encounter (HOSPITAL_COMMUNITY): Payer: Self-pay | Admitting: *Deleted

## 2015-05-08 ENCOUNTER — Emergency Department (HOSPITAL_COMMUNITY)
Admission: EM | Admit: 2015-05-08 | Discharge: 2015-05-09 | Disposition: A | Discharge status: Home / Self Care | Payer: Medicaid Other | Attending: Emergency Medicine | Admitting: Emergency Medicine

## 2015-05-08 DIAGNOSIS — Z9889 Other specified postprocedural states: Secondary | ICD-10-CM | POA: Diagnosis not present

## 2015-05-08 DIAGNOSIS — M25561 Pain in right knee: Secondary | ICD-10-CM | POA: Diagnosis present

## 2015-05-08 DIAGNOSIS — J45909 Unspecified asthma, uncomplicated: Secondary | ICD-10-CM | POA: Insufficient documentation

## 2015-05-08 DIAGNOSIS — F329 Major depressive disorder, single episode, unspecified: Secondary | ICD-10-CM | POA: Insufficient documentation

## 2015-05-08 DIAGNOSIS — F419 Anxiety disorder, unspecified: Secondary | ICD-10-CM | POA: Insufficient documentation

## 2015-05-08 DIAGNOSIS — Z79899 Other long term (current) drug therapy: Secondary | ICD-10-CM | POA: Insufficient documentation

## 2015-05-08 DIAGNOSIS — K219 Gastro-esophageal reflux disease without esophagitis: Secondary | ICD-10-CM | POA: Diagnosis not present

## 2015-05-08 DIAGNOSIS — M1711 Unilateral primary osteoarthritis, right knee: Secondary | ICD-10-CM | POA: Diagnosis not present

## 2015-05-08 DIAGNOSIS — G8929 Other chronic pain: Secondary | ICD-10-CM | POA: Insufficient documentation

## 2015-05-08 DIAGNOSIS — M25469 Effusion, unspecified knee: Secondary | ICD-10-CM

## 2015-05-08 NOTE — ED Notes (Signed)
Pt states she had right knee surgery last year and she has re-injured her right knee. Pt c/o stabbing, throbbing pain.

## 2015-05-08 NOTE — Telephone Encounter (Signed)
Notes faxed.

## 2015-05-09 MED ORDER — PREDNISONE 50 MG PO TABS
60.0000 mg | ORAL_TABLET | Freq: Once | ORAL | Status: AC
  Administered 2015-05-09: 60 mg via ORAL
  Filled 2015-05-09 (×2): qty 1

## 2015-05-09 MED ORDER — MELOXICAM 15 MG PO TABS: 15.0000 mg | ORAL_TABLET | Freq: Every day | ORAL | Status: DC

## 2015-05-09 MED ORDER — PROMETHAZINE HCL 12.5 MG PO TABS
12.5000 mg | ORAL_TABLET | Freq: Once | ORAL | Status: AC
  Administered 2015-05-09: 12.5 mg via ORAL
  Filled 2015-05-09: qty 1

## 2015-05-09 MED ORDER — TRAMADOL HCL 50 MG PO TABS: ORAL_TABLET | ORAL | Status: DC

## 2015-05-09 MED ORDER — TRAMADOL HCL 50 MG PO TABS
100.0000 mg | ORAL_TABLET | Freq: Once | ORAL | Status: AC
  Administered 2015-05-09: 100 mg via ORAL

## 2015-05-09 MED ORDER — KETOROLAC TROMETHAMINE 10 MG PO TABS: 10.0000 mg | ORAL_TABLET | Freq: Three times a day (TID) | ORAL | Status: DC

## 2015-05-09 MED ORDER — KETOROLAC TROMETHAMINE 10 MG PO TABS
10.0000 mg | ORAL_TABLET | Freq: Once | ORAL | Status: AC
Start: 2015-05-09 — End: 2015-05-09
  Administered 2015-05-09: 10 mg via ORAL
  Filled 2015-05-09: qty 1

## 2015-05-09 NOTE — Discharge Instructions (Signed)
Your xray reveals multiple areas of arthritis. Please use the knee immobilizer and crutches until seen by orthopedics. Use mobic daily with food. Use ultram for pain if needed. This medication may cause drowsiness. Osteoarthritis Osteoarthritis is a disease that causes soreness and inflammation of a joint. It occurs when the cartilage at the affected joint wears down. Cartilage acts as a cushion, covering the ends of bones where they meet to form a joint. Osteoarthritis is the most common form of arthritis. It often occurs in older people. The joints affected most often by this condition include those in the:  Ends of the fingers.  Thumbs.  Neck.  Lower back.  Knees.  Hips. CAUSES  Over time, the cartilage that covers the ends of bones begins to wear away. This causes bone to rub on bone, producing pain and stiffness in the affected joints.  RISK FACTORS Certain factors can increase your chances of having osteoarthritis, including:  Older age.  Excessive body weight.  Overuse of joints.  Previous joint injury. SIGNS AND SYMPTOMS   Pain, swelling, and stiffness in the joint.  Over time, the joint may lose its normal shape.  Small deposits of bone (osteophytes) may grow on the edges of the joint.  Bits of bone or cartilage can break off and float inside the joint space. This may cause more pain and damage. DIAGNOSIS  Your health care provider will do a physical exam and ask about your symptoms. Various tests may be ordered, such as:  X-rays of the affected joint.  Blood tests to rule out other types of arthritis. Additional tests may be used to diagnose your condition. TREATMENT  Goals of treatment are to control pain and improve joint function. Treatment plans may include:  A prescribed exercise program that allows for rest and joint relief.  A weight control plan.  Pain relief techniques, such as:  Properly applied heat and cold.  Electric pulses delivered to  nerve endings under the skin (transcutaneous electrical nerve stimulation [TENS]).  Massage.  Certain nutritional supplements.  Medicines to control pain, such as:  Acetaminophen.  Nonsteroidal anti-inflammatory drugs (NSAIDs), such as naproxen.  Narcotic or central-acting agents, such as tramadol.  Corticosteroids. These can be given orally or as an injection.  Surgery to reposition the bones and relieve pain (osteotomy) or to remove loose pieces of bone and cartilage. Joint replacement may be needed in advanced states of osteoarthritis. HOME CARE INSTRUCTIONS   Take medicines only as directed by your health care provider.  Maintain a healthy weight. Follow your health care provider's instructions for weight control. This may include dietary instructions.  Exercise as directed. Your health care provider can recommend specific types of exercise. These may include:  Strengthening exercises. These are done to strengthen the muscles that support joints affected by arthritis. They can be performed with weights or with exercise bands to add resistance.  Aerobic activities. These are exercises, such as brisk walking or low-impact aerobics, that get your heart pumping.  Range-of-motion activities. These keep your joints limber.  Balance and agility exercises. These help you maintain daily living skills.  Rest your affected joints as directed by your health care provider.  Keep all follow-up visits as directed by your health care provider. SEEK MEDICAL CARE IF:   Your skin turns red.  You develop a rash in addition to your joint pain.  You have worsening joint pain.  You have a fever along with joint or muscle aches. SEEK IMMEDIATE MEDICAL CARE IF:  You have a significant loss of weight or appetite.  You have night sweats. FOR MORE INFORMATION   National Institute of Arthritis and Musculoskeletal and Skin Diseases: www.niams.http://www.myers.net/nih.gov  [employer] on Aging:  https://walker.com/www.nia.nih.gov  American College of Rheumatology: www.rheumatology.org   This information is not intended to replace advice given to you by your health care provider. Make sure you discuss any questions you have with your health care provider.   Document Released: 06/27/2005 Document Revised: 07/18/2014 Document Reviewed: 03/04/2013 Elsevier Interactive Patient Education [employer] Elsevier Inc.

## 2015-05-11 NOTE — ED Provider Notes (Signed)
CSN: [id number]     Arrival date & time 05/08/15  2235 History   First MD Initiated Contact with Patient 05/09/15 0006     Chief Complaint  Patient presents with  . Knee Pain     (Consider location/radiation/quality/duration/timing/severity/associated sxs/prior Treatment) Patient is a 39 y.o. female presenting with knee pain. The history is provided by the patient.  Knee Pain Location:  Knee Knee location:  R knee Pain details:    Quality:  Throbbing and sharp   Radiates to:  R leg   Severity:  Moderate   Onset quality: acute on chronic.   Timing:  Intermittent   Progression:  Worsening Dislocation: no   Prior injury to area:  Yes Relieved by:  Nothing Worsened by:  Bearing weight, extension and flexion Ineffective treatments:  Rest and arthritis medication Associated symptoms: decreased ROM and stiffness   Associated symptoms: no fever   Risk factors: known bone disorder   Risk factors: no frequent fractures     Past Medical History  Diagnosis Date  . Insomnia   . Anxiety   . Allergic rhinitis   . Reactive airway disease   . Depression   . Bronchitis   . Sinusitis   . Exposure to hepatitis C     Exposure (Husband is positive)  . GERD (gastroesophageal reflux disease)    Past Surgical History  Procedure Laterality Date  . Reconstructive for feet and leg Bilateral 1985  . Cholecystectomy  1997  . Carpal tunnel release Bilateral 2010 & 2011  . Knee arthroscopy Right   . Esophagogastroduodenoscopy N/A 12/09/2014    SLF:1. severe erosive esophagitis. 2. Patent esophageal stricture 3. Non-erosive gastritis.   . Esophagogastroduodenoscopy (egd) with propofol N/A 12/23/2014    SLF: 1. Moderate dostal esophagitis 2. Mild erosive gastritis and duodenitis.   . Esophageal dilation N/A 12/23/2014    Procedure: ESOPHAGEAL DILATION, 6814fr , 5415fr, 2716fr;  Surgeon: West BaliSandi L Fields, MD;  Location: [facility];  Service: Endoscopy;  Laterality: N/A;  . Esophageal biopsy N/A 12/23/2014   Procedure: GASTRIC BIOPSIES;  Surgeon: West BaliSandi L Fields, MD;  Location: [facility];  Service: Endoscopy;  Laterality: N/A;   Family History  Problem Relation Age of Onset  . Diabetes Maternal Grandmother   . Ovarian cancer Mother   . Thyroid cancer Father   . Cancer Sister   . Cancer Maternal Grandmother   . Cancer Paternal Grandfather   . Healthy Daughter   . Healthy Son     x 3  . Colon cancer Neg Hx    Social History  Substance Use Topics  . Smoking status: Never Smoker   . Smokeless tobacco: None     Comment: Never smoked  . Alcohol Use: No   OB History    No data available     Review of Systems  Constitutional: Negative for fever.  Musculoskeletal: Positive for arthralgias and stiffness.  All other systems reviewed and are negative.     Allergies  Codeine and Ativan  Home Medications   Prior to Admission medications   Medication Sig Start Date End Date Taking? Authorizing Provider  ibuprofen (ADVIL,MOTRIN) 800 MG tablet Take 1 tablet (800 mg total) by mouth every 8 (eight) hours as needed. 05/07/15  Yes Mechele ClaudeWarren Stacks, MD  MIRENA 20 MCG/24HR IUD 1 each by Intrauterine route once.  08/14/14  Yes Historical Provider, MD  escitalopram (LEXAPRO) 10 MG tablet Take 1 tablet (10 mg total) by mouth daily. 05/05/15   Mechele ClaudeWarren Stacks, MD  gabapentin (NEURONTIN) 300 MG capsule Take 1 capsule (300 mg total) by mouth 3 (three) times daily. 1 capsule on day 1, BID on day 2, increase to TID on day 3 Patient not taking: Reported on 05/08/2015 12/19/14   Tiffany A Gann, PA-C  meloxicam (MOBIC) 15 MG tablet Take 1 tablet (15 mg total) by mouth daily. 05/09/15   Ivery Quale, PA-C  pantoprazole (PROTONIX) 40 MG tablet 1 PO 30 MINUTES PRIOR TO MEALS BID FOR 3 MOS THEN QD Patient not taking: Reported on 05/08/2015 12/23/14   West Bali, MD  traMADol (ULTRAM) 50 MG tablet 1 or 2 po q6h prn pain 05/09/15   Ivery Quale, PA-C   BP 172/90 mmHg  Pulse 72  Temp(Src) 97.8 F (36.6 C) (Oral)   Resp 16  Ht  (1.753 m)  Wt 227 lb (102.967 kg)  BMI 33.51 kg/m2  SpO2 100% Physical Exam  Constitutional: She is oriented to person, place, and time. She appears well-developed and well-nourished.  Non-toxic appearance.  HENT:  Head: Normocephalic.  Right Ear: Tympanic membrane and external ear normal.  Left Ear: Tympanic membrane and external ear normal.  Eyes: EOM and lids are normal. Pupils are equal, round, and reactive to light.  Neck: Normal range of motion. Neck supple. Carotid bruit is not present.  Cardiovascular: Normal rate, regular rhythm, normal heart sounds, intact distal pulses and normal pulses.   Pulmonary/Chest: Breath sounds normal. No respiratory distress.  Abdominal: Soft. Bowel sounds are normal. There is no tenderness. There is no guarding.  Musculoskeletal:       Right knee: She exhibits decreased range of motion and effusion. Tenderness found. Medial joint line and lateral joint line tenderness noted.  No hot joints noted.  Lymphadenopathy:       Head (right side): No submandibular adenopathy present.       Head (left side): No submandibular adenopathy present.    She has no cervical adenopathy.  Neurological: She is alert and oriented to person, place, and time. She has normal strength. No cranial nerve deficit or sensory deficit.  Skin: Skin is warm and dry.  Psychiatric: She has a normal mood and affect. Her speech is normal.  Nursing note and vitals reviewed.   ED Course  Procedures (including critical care time) Labs Review Labs Reviewed - No data to display  Imaging Review No results found. I have personally reviewed and evaluated these images and lab results as part of my medical decision-making.   EKG Interpretation None      MDM  X-ray reveals moderate joint effusion. There is no fracture or dislocation. There are osteoarthritis changes noted at several areas. Suspect that the patient has an exacerbation of her chronic knee pain and  her degenerative joint disease involving the knee. Patient strongly encouraged to see the orthopedic specialist. A prescription for mobic, Ultram, given to the patient.    Final diagnoses:  Primary osteoarthritis of right knee  Knee effusion, unspecified laterality    *I have reviewed nursing notes, vital signs, and all appropriate lab and imaging results for this patient.Ivery Quale, PA-C 05/11/15 2108  Layla Maw Ward, DO 05/12/15 [phone]

## 2015-05-19 ENCOUNTER — Ambulatory Visit: Payer: Medicaid Other | Admitting: Family

## 2015-05-20 ENCOUNTER — Encounter: Payer: Self-pay | Admitting: Family Medicine

## 2015-05-20 ENCOUNTER — Ambulatory Visit (INDEPENDENT_AMBULATORY_CARE_PROVIDER_SITE_OTHER): Payer: Medicaid Other | Admitting: Family Medicine

## 2015-05-20 VITALS — BP 147/97 | HR 88 | Temp 98.1°F | Ht 69.0 in | Wt 233.4 lb

## 2015-05-20 DIAGNOSIS — J209 Acute bronchitis, unspecified: Secondary | ICD-10-CM

## 2015-05-20 DIAGNOSIS — R52 Pain, unspecified: Secondary | ICD-10-CM | POA: Diagnosis not present

## 2015-05-20 DIAGNOSIS — J029 Acute pharyngitis, unspecified: Secondary | ICD-10-CM

## 2015-05-20 DIAGNOSIS — R6883 Chills (without fever): Secondary | ICD-10-CM

## 2015-05-20 LAB — POCT INFLUENZA A/B
INFLUENZA A, POC: NEGATIVE
INFLUENZA B, POC: NEGATIVE

## 2015-05-20 LAB — POCT RAPID STREP A (OFFICE): Rapid Strep A Screen: NEGATIVE

## 2015-05-20 MED ORDER — BENZONATATE 100 MG PO CAPS: 100.0000 mg | ORAL_CAPSULE | Freq: Two times a day (BID) | ORAL | Status: DC | PRN

## 2015-05-20 MED ORDER — AZITHROMYCIN 250 MG PO TABS: ORAL_TABLET | ORAL | Status: DC

## 2015-05-20 NOTE — Progress Notes (Signed)
   HPI  Patient presents today here with acute illness.  Patient's lines that she's had about one week of symptoms, she states that her symptoms began suddenly and that over the last 3 days she's had severe malaise doing almost nothing but sleeping and resting. She is not improving today. She describes one week of ear pain bilaterally, nasal congestion, cough, chills, night sweats, body aches, and small white bumps in the mouth that have been coming and going.  She also states that she is short of breath but has comfortable work of breathing. She has decreased appetite but normal oral tolerance.  PMH: Smoking status noted ROS: Per HPI  Objective: BP 147/97 mmHg  Pulse 88  Temp(Src) 98.1 F (36.7 C) (Oral)  Ht 5\' 9"  (1.753 m)  Wt 233 lb 6.4 oz (105.87 kg)  BMI 34.45 kg/m2 Gen: NAD, alert, cooperative with exam HEENT: NCAT, TMs normal bilaterally, oropharynx with large swollen tonsils but no erythema or exudates Neck: Supple large swollen lymph nodes that are tender to palpation CV: RRR, good S1/S2, no murmur Resp: Nonlabored, slight expiratory wheeze scattered Ext: No edema, warm Neuro: Alert and oriented, No gross deficits  Assessment and plan:  # Upper respiratory infection Considering severity and duration of illness I will go ahead and treat as acute bronchitis with azithromycin Also given Tessalon for cough. Reasons to return reviewed in detail   Orders Placed This Encounter  Procedures  . Culture, Group A Strep  . POCT rapid strep A  . POCT Influenza A/B    Rapid Flu and strep negative, strep culture sent   Murtis SinkSam Bradshaw, MD [facility] 05/20/2015, 12:02 PM

## 2015-05-20 NOTE — Patient Instructions (Signed)
Great to meet you!  Come back if you get worse or do not get better as expected.   Acute Bronchitis Bronchitis is inflammation of the airways that extend from the windpipe into the lungs (bronchi). The inflammation often causes mucus to develop. This leads to a cough, which is the most common symptom of bronchitis.  In acute bronchitis, the condition usually develops suddenly and goes away over time, usually in a couple weeks. Smoking, allergies, and asthma can make bronchitis worse. Repeated episodes of bronchitis may cause further lung problems.  CAUSES Acute bronchitis is most often caused by the same virus that causes a cold. The virus can spread from person to person (contagious) through coughing, sneezing, and touching contaminated objects. SIGNS AND SYMPTOMS   Cough.   Fever.   Coughing up mucus.   Body aches.   Chest congestion.   Chills.   Shortness of breath.   Sore throat.  DIAGNOSIS  Acute bronchitis is usually diagnosed through a physical exam. Your health care provider will also ask you questions about your medical history. Tests, such as chest X-rays, are sometimes done to rule out other conditions.  TREATMENT  Acute bronchitis usually goes away in a couple weeks. Oftentimes, no medical treatment is necessary. Medicines are sometimes given for relief of fever or cough. Antibiotic medicines are usually not needed but may be prescribed in certain situations. In some cases, an inhaler may be recommended to help reduce shortness of breath and control the cough. A cool mist vaporizer may also be used to help thin bronchial secretions and make it easier to clear the chest.  HOME CARE INSTRUCTIONS  Get plenty of rest.   Drink enough fluids to keep your urine clear or pale yellow (unless you have a medical condition that requires fluid restriction). Increasing fluids may help thin your respiratory secretions (sputum) and reduce chest congestion, and it will prevent  dehydration.   Take medicines only as directed by your health care provider.  If you were prescribed an antibiotic medicine, finish it all even if you start to feel better.  Avoid smoking and secondhand smoke. Exposure to cigarette smoke or irritating chemicals will make bronchitis worse. If you are a smoker, consider using nicotine gum or skin patches to help control withdrawal symptoms. Quitting smoking will help your lungs heal faster.   Reduce the chances of another bout of acute bronchitis by washing your hands frequently, avoiding people with cold symptoms, and trying not to touch your hands to your mouth, nose, or eyes.   Keep all follow-up visits as directed by your health care provider.  SEEK MEDICAL CARE IF: Your symptoms do not improve after 1 week of treatment.  SEEK IMMEDIATE MEDICAL CARE IF:  You develop an increased fever or chills.   You have chest pain.   You have severe shortness of breath.  You have bloody sputum.   You develop dehydration.  You faint or repeatedly feel like you are going to pass out.  You develop repeated vomiting.  You develop a severe headache. MAKE SURE YOU:   Understand these instructions.  Will watch your condition.  Will get help right away if you are not doing well or get worse.   This information is not intended to replace advice given to you by your health care provider. Make sure you discuss any questions you have with your health care provider.   Document Released: 08/04/2004 Document Revised: 07/18/2014 Document Reviewed: 12/18/2012 Elsevier Interactive Patient Education 2016 [employer]

## 2015-05-22 LAB — CULTURE, GROUP A STREP: Strep A Culture: NEGATIVE

## 2015-05-28 ENCOUNTER — Emergency Department (HOSPITAL_COMMUNITY)
Admission: EM | Admit: 2015-05-28 | Discharge: 2015-05-28 | Disposition: A | Discharge status: Home / Self Care | Payer: Medicaid Other | Attending: Emergency Medicine | Admitting: Emergency Medicine

## 2015-05-28 ENCOUNTER — Encounter (HOSPITAL_COMMUNITY): Payer: Self-pay | Admitting: Emergency Medicine

## 2015-05-28 DIAGNOSIS — R1013 Epigastric pain: Secondary | ICD-10-CM | POA: Insufficient documentation

## 2015-05-28 DIAGNOSIS — Z79899 Other long term (current) drug therapy: Secondary | ICD-10-CM | POA: Insufficient documentation

## 2015-05-28 DIAGNOSIS — K219 Gastro-esophageal reflux disease without esophagitis: Secondary | ICD-10-CM | POA: Insufficient documentation

## 2015-05-28 DIAGNOSIS — Z792 Long term (current) use of antibiotics: Secondary | ICD-10-CM | POA: Insufficient documentation

## 2015-05-28 DIAGNOSIS — F329 Major depressive disorder, single episode, unspecified: Secondary | ICD-10-CM | POA: Diagnosis not present

## 2015-05-28 DIAGNOSIS — F419 Anxiety disorder, unspecified: Secondary | ICD-10-CM | POA: Insufficient documentation

## 2015-05-28 DIAGNOSIS — G4700 Insomnia, unspecified: Secondary | ICD-10-CM | POA: Diagnosis not present

## 2015-05-28 DIAGNOSIS — R079 Chest pain, unspecified: Secondary | ICD-10-CM | POA: Insufficient documentation

## 2015-05-28 DIAGNOSIS — J45901 Unspecified asthma with (acute) exacerbation: Secondary | ICD-10-CM | POA: Insufficient documentation

## 2015-05-28 LAB — CBC WITH DIFFERENTIAL/PLATELET
BASOS PCT: 0 %
Basophils Absolute: 0 10*3/uL (ref 0.0–0.1)
EOS ABS: 0.1 10*3/uL (ref 0.0–0.7)
Eosinophils Relative: 1 %
HCT: 38.6 % (ref 36.0–46.0)
HEMOGLOBIN: 12.9 g/dL (ref 12.0–15.0)
LYMPHS ABS: 1.3 10*3/uL (ref 0.7–4.0)
Lymphocytes Relative: 13 %
MCH: 30.2 pg (ref 26.0–34.0)
MCHC: 33.4 g/dL (ref 30.0–36.0)
MCV: 90.4 fL (ref 78.0–100.0)
Monocytes Absolute: 0.8 10*3/uL (ref 0.1–1.0)
Monocytes Relative: 8 %
NEUTROS ABS: 7.5 10*3/uL (ref 1.7–7.7)
NEUTROS PCT: 78 %
Platelets: 200 10*3/uL (ref 150–400)
RBC: 4.27 MIL/uL (ref 3.87–5.11)
RDW: 13 % (ref 11.5–15.5)
WBC: 9.7 10*3/uL (ref 4.0–10.5)

## 2015-05-28 LAB — COMPREHENSIVE METABOLIC PANEL
ALBUMIN: 4 g/dL (ref 3.5–5.0)
ALT: 39 U/L (ref 14–54)
ANION GAP: 5 (ref 5–15)
AST: 95 U/L — ABNORMAL HIGH (ref 15–41)
Alkaline Phosphatase: 27 U/L — ABNORMAL LOW (ref 38–126)
BILIRUBIN TOTAL: 0.6 mg/dL (ref 0.3–1.2)
BUN: 16 mg/dL (ref 6–20)
CHLORIDE: 107 mmol/L (ref 101–111)
CO2: 27 mmol/L (ref 22–32)
Calcium: 9.2 mg/dL (ref 8.9–10.3)
Creatinine, Ser: 0.56 mg/dL (ref 0.44–1.00)
GFR calc Af Amer: >60 mL/min (ref >60)
GFR calc non Af Amer: >60 mL/min (ref >60)
GLUCOSE: 113 mg/dL — AB (ref 65–99)
POTASSIUM: 4.1 mmol/L (ref 3.5–5.1)
Sodium: 139 mmol/L (ref 135–145)
TOTAL PROTEIN: 7.3 g/dL (ref 6.5–8.1)

## 2015-05-28 LAB — TROPONIN I

## 2015-05-28 LAB — LIPASE, BLOOD: Lipase: 36 U/L (ref 11–51)

## 2015-05-28 LAB — ETHANOL

## 2015-05-28 MED ORDER — SUCRALFATE 1 G PO TABS: 1.0000 g | ORAL_TABLET | Freq: Three times a day (TID) | ORAL | Status: AC

## 2015-05-28 MED ORDER — SODIUM CHLORIDE 0.9 % IV BOLUS (SEPSIS)
500.0000 mL | Freq: Once | INTRAVENOUS | Status: AC
  Administered 2015-05-28: 500 mL via INTRAVENOUS

## 2015-05-28 MED ORDER — GI COCKTAIL ~~LOC~~
30.0000 mL | Freq: Once | ORAL | Status: AC
  Administered 2015-05-28: 30 mL via ORAL
  Filled 2015-05-28: qty 30

## 2015-05-28 MED ORDER — ACETAMINOPHEN 500 MG PO TABS: 500.0000 mg | ORAL_TABLET | Freq: Four times a day (QID) | ORAL | Status: AC | PRN

## 2015-05-28 MED ORDER — DIPHENHYDRAMINE HCL 50 MG/ML IJ SOLN
25.0000 mg | Freq: Once | INTRAMUSCULAR | Status: AC
  Administered 2015-05-28: 25 mg via INTRAVENOUS
  Filled 2015-05-28: qty 1

## 2015-05-28 NOTE — ED Provider Notes (Signed)
CSN: [id number]     Arrival date & time 05/28/15  [id number] History  By signing my name below, I, Ronney LionSuzanne Le, attest that this documentation has been prepared under the direction and in the presence of Gerhard Munchobert Madisin Hasan, MD. Electronically Signed: Ronney LionSuzanne Le, ED Scribe. 05/28/2015. 10:45 AM.   Chief Complaint  Patient presents with  . Anxiety   The history is provided by the patient. No language interpreter was used.    HPI Comments: Roberto ScalesChristina M Mclennan is a 39 y.o. female with a history of anxiety, who presents to the Emergency Department complaining of anxiety and squeezing epigastric pain that began this morning at 8 AM, about 2 hours ago. Patient states she is allergic to codeine and had unknowingly taken Tylenol with Codeine this morning prescribed for an orthopedic right knee surgery in 4 days. She states she was in the courtroom this morning and took the medication to alleviate her right knee pain, not paying attention to what it was. She developed what possibly looked like hives on her neck, per patient's son. She states these are all symptoms that occurred the last time she had an allergic reaction to codeine. Patient states she went to bed last night feeling okay. She notes a history of cholecystectomy. She denies a history of cardiac conditions. She denies nausea, vomiting, or fever. Patient denies a history of smoking or any EtOH consumption. Patient had been taking ibuprofen 800mg  "off and on for a while."  Past Medical History  Diagnosis Date  . Insomnia   . Anxiety   . Allergic rhinitis   . Reactive airway disease   . Depression   . Bronchitis   . Sinusitis   . Exposure to hepatitis C     Exposure (Husband is positive)  . GERD (gastroesophageal reflux disease)    Past Surgical History  Procedure Laterality Date  . Reconstructive for feet and leg Bilateral 1985  . Cholecystectomy  1997  . Carpal tunnel release Bilateral 2010 & 2011  . Knee arthroscopy Right   .  Esophagogastroduodenoscopy N/A 12/09/2014    SLF:1. severe erosive esophagitis. 2. Patent esophageal stricture 3. Non-erosive gastritis.   . Esophagogastroduodenoscopy (egd) with propofol N/A 12/23/2014    SLF: 1. Moderate dostal esophagitis 2. Mild erosive gastritis and duodenitis.   . Esophageal dilation N/A 12/23/2014    Procedure: ESOPHAGEAL DILATION, 4414fr , 3315fr, 916fr;  Surgeon: West BaliSandi L Fields, MD;  Location: [facility];  Service: Endoscopy;  Laterality: N/A;  . Esophageal biopsy N/A 12/23/2014    Procedure: GASTRIC BIOPSIES;  Surgeon: West BaliSandi L Fields, MD;  Location: [facility];  Service: Endoscopy;  Laterality: N/A;   Family History  Problem Relation Age of Onset  . Diabetes Maternal Grandmother   . Ovarian cancer Mother   . Thyroid cancer Father   . Cancer Sister   . Cancer Maternal Grandmother   . Cancer Paternal Grandfather   . Healthy Daughter   . Healthy Son     x 3  . Colon cancer Neg Hx    Social History  Substance Use Topics  . Smoking status: Never Smoker   . Smokeless tobacco: None     Comment: Never smoked  . Alcohol Use: No   OB History    No data available     Review of Systems  Constitutional: Negative for fever.       Per HPI, otherwise negative  HENT:       Per HPI, otherwise negative  Respiratory: Positive for shortness  of breath.        Per HPI, otherwise negative  Cardiovascular: Positive for chest pain.       Per HPI, otherwise negative  Gastrointestinal: Negative for nausea and vomiting.  Endocrine:       Negative aside from HPI  Genitourinary:       Neg aside from HPI   Musculoskeletal:       Per HPI, otherwise negative  Skin: Negative.   Neurological: Negative for syncope.      Allergies  Codeine and Ativan  Home Medications   Prior to Admission medications   Medication Sig Start Date End Date Taking? Authorizing Provider  azithromycin (ZITHROMAX) 250 MG tablet Take 2 tablets on day 1 and 1 tablet daily after that 05/20/15   Elenora Gamma, MD  benzonatate (TESSALON) 100 MG capsule Take 1 capsule (100 mg total) by mouth 2 (two) times daily as needed for cough. 05/20/15   Elenora Gamma, MD  escitalopram (LEXAPRO) 10 MG tablet Take 1 tablet (10 mg total) by mouth daily. 05/05/15   Mechele Claude, MD  gabapentin (NEURONTIN) 300 MG capsule Take 1 capsule (300 mg total) by mouth 3 (three) times daily. 1 capsule on day 1, BID on day 2, increase to TID on day 3 12/19/14   Tiffany A Gann, PA-C  ibuprofen (ADVIL,MOTRIN) 800 MG tablet Take 1 tablet (800 mg total) by mouth every 8 (eight) hours as needed. 05/07/15   Mechele Claude, MD  MIRENA 20 MCG/24HR IUD 1 each by Intrauterine route once.  08/14/14   Historical Provider, MD  pantoprazole (PROTONIX) 40 MG tablet 1 PO 30 MINUTES PRIOR TO MEALS BID FOR 3 MOS THEN QD 12/23/14   Sandi L Fields, MD   BP 138/92 mmHg  Pulse 63  Temp(Src) 98.1 F (36.7 C) (Oral)  Resp 24  Ht  (1.753 m)  Wt 234 lb (106.142 kg)  BMI 34.54 kg/m2  SpO2 100% Physical Exam  Constitutional: She is oriented to person, place, and time. She appears well-developed and well-nourished. No distress.  HENT:  Head: Normocephalic and atraumatic.  Eyes: Conjunctivae and EOM are normal.  Cardiovascular: Normal rate and regular rhythm.   Pulmonary/Chest: Effort normal and breath sounds normal. No stridor. No respiratory distress.  Abdominal: She exhibits no distension.  Musculoskeletal: She exhibits no edema.  Neurological: She is alert and oriented to person, place, and time. No cranial nerve deficit.  Skin: Skin is warm and dry.  Psychiatric: She has a normal mood and affect.  Nursing note and vitals reviewed.   ED Course  Procedures (including critical care time)  DIAGNOSTIC STUDIES: Oxygen Saturation is 100% on RA, normal by my interpretation.    COORDINATION OF CARE: 10:06 AM - Discussed treatment plan with pt at bedside which includes medications. Pt verbalized understanding and agreed to plan.    Labs Review Labs Reviewed  COMPREHENSIVE METABOLIC PANEL - Abnormal; Notable for the following:    Glucose, Bld 113 (*)    AST 95 (*)    Alkaline Phosphatase 27 (*)    All other components within normal limits  ETHANOL  LIPASE, BLOOD  CBC WITH DIFFERENTIAL/PLATELET  TROPONIN I   I have personally reviewed and evaluated these lab results as part of my medical decision-making.   EKG Interpretation   Date/Time:  Thursday May 28 2015 10:03:06 EST Ventricular Rate:  71 PR Interval:  126 QRS Duration: 115 QT Interval:  461 QTC Calculation: 501 R Axis:   16  Text Interpretation:  Sinus rhythm Nonspecific intraventricular conduction  delay Low voltage, precordial leads Baseline wander in lead(s) V4 Sinus  rhythm Artifact Abnormal ekg Confirmed by Gerhard Munch  MD [phone]) on  05/28/2015 10:06:12 AM     12:23 PM Patient states that she feels better. We discussed all findings return precautions, follow-up instructions.  MDM    I personally performed the services described in this documentation, which was scribed in my presence. The recorded information has been reviewed and is accurate.    Patient presents with epigastric discomfort. Patient was well prior to symptoms, which began about 2 hours ago. Patient has improvement, substantially, with GI cocktail, fluids. No evidence for ongoing coronary ischemia, peritonitis, or other acute new pathology.  Symptoms maybe secondary to esophageal spasm versus gastritis.  Patient appropriate for discharge with outpatient follow-up after initiation of appropriate medication.  Gerhard Munch, MD 05/28/15 1226

## 2015-05-28 NOTE — ED Notes (Signed)
Patient with no complaints at this time. Respirations even and unlabored. Skin warm/dry. Discharge instructions reviewed with patient at this time. Patient given opportunity to voice concerns/ask questions. IV removed per policy and band-aid applied to site. Patient discharged at this time and left Emergency Department with steady gait.  

## 2015-05-28 NOTE — ED Notes (Signed)
Pt c/o cp 1.5 hours after unknowingly taking tylenol with codiene. Pt appears anxious, hyperventilating and tearful.

## 2015-05-28 NOTE — ED Notes (Signed)
Patient anxious. No redirectable at this time. States she took a pain medication with codeine in it that her Dr Rx'd for her and that she is allergic to codeine. [facility]. States Chest pressure.

## 2015-05-28 NOTE — ED Notes (Signed)
[facility] [place], patient was prescribed Tylenol #3 yesterday.

## 2015-05-28 NOTE — Discharge Instructions (Signed)
As discussed, your evaluation today has been largely reassuring.  But, it is important that you monitor your condition carefully, and do not hesitate to return to the ED if you develop new, or concerning changes in your condition.  Symptoms may be due to an allergic reaction, but an additional possibility is your chronic use of ibuprofen.  Please use the newly prescribed medication as directed, and use Tylenol for pain control.

## 2015-06-01 ENCOUNTER — Other Ambulatory Visit: Payer: Self-pay | Admitting: Family Medicine

## 2015-06-02 ENCOUNTER — Ambulatory Visit: Payer: Medicaid Other | Admitting: Family Medicine

## 2015-06-03 ENCOUNTER — Encounter: Payer: Self-pay | Admitting: Physician Assistant

## 2015-06-05 ENCOUNTER — Other Ambulatory Visit: Payer: Self-pay | Admitting: Physician Assistant

## 2015-06-24 NOTE — Telephone Encounter (Signed)
Patient to follow up with orthopedic

## 2015-07-03 ENCOUNTER — Ambulatory Visit: Payer: Medicaid Other | Admitting: Family Medicine

## 2015-07-14 MED FILL — ZUBSOLV 5.7-1.4 MG TAB SL: 5.7-1.4 | 7 days supply | Qty: 21 | Fill #0

## 2015-07-27 ENCOUNTER — Ambulatory Visit: Payer: Medicaid Other | Admitting: Family Medicine

## 2015-09-29 ENCOUNTER — Telehealth: Payer: Self-pay | Admitting: Physician Assistant

## 2016-02-29 ENCOUNTER — Other Ambulatory Visit: Payer: Self-pay | Admitting: Gastroenterology

## 2016-03-01 NOTE — Telephone Encounter (Signed)
One refill provided. Patient has not been seen since early 2016 and no showed last ov.

## 2016-03-02 NOTE — Telephone Encounter (Signed)
Letter mailed to pt to call for appt.  

## 2016-06-15 IMAGING — CT CT ABDOMEN W/ CM
2 of 7 series · 13 of 46 positions shown, 18 images · IV contrast (Omnipaque 300)
Comparison: MRI lumbar spine 06/25/2014

CLINICAL DATA: Patient with worsening nausea and vomiting for 3
months. Abdominal pain. Weight loss.

EXAM:
CT ABDOMEN WITH CONTRAST
TECHNIQUE: Multidetector CT imaging of the abdomen was performed using the
standard protocol following bolus administration of intravenous
contrast.
CONTRAST:  100 cc Omnipaque 300

[Series 2: abd_pel_with 5.0 b40f · axial · 0.71mm/px · z∈[-310,-54]mm · 10 of 59 slices shown, 15 images]
[im 4/59  soft-tissue]
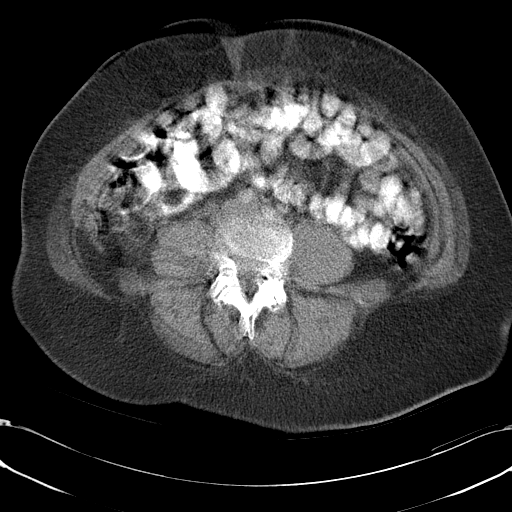
[im 4/59  bone]
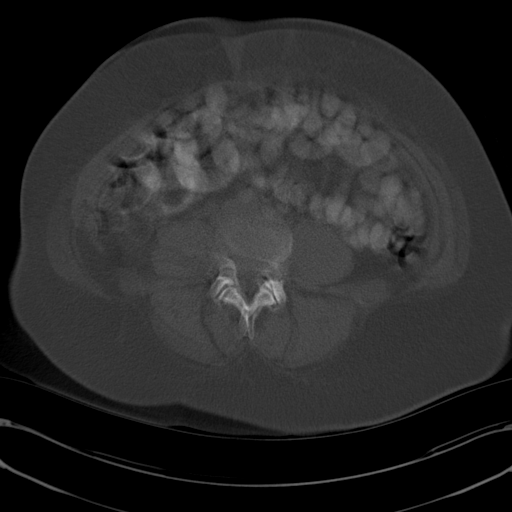
[im 12/59  soft-tissue]
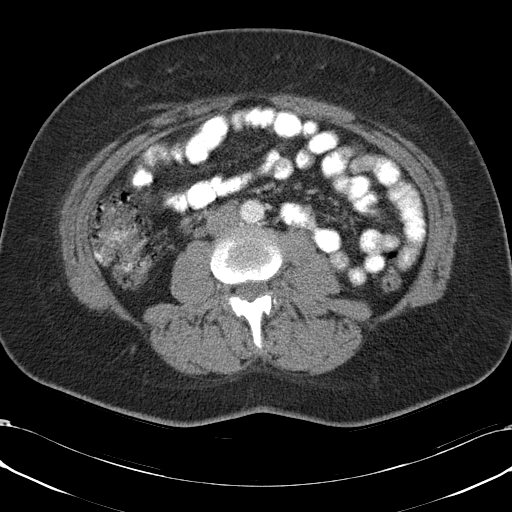
[im 16/59  soft-tissue]
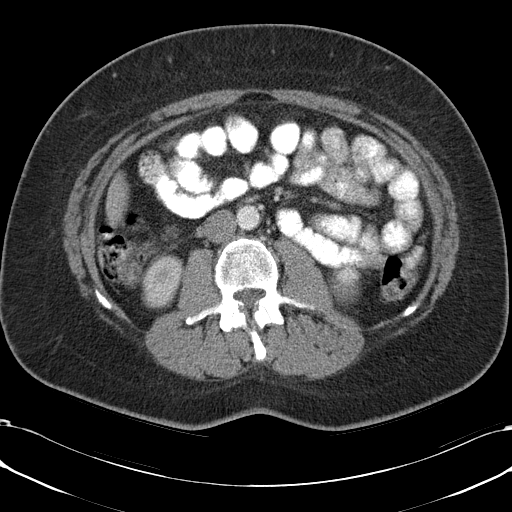
[im 24/59  soft-tissue]
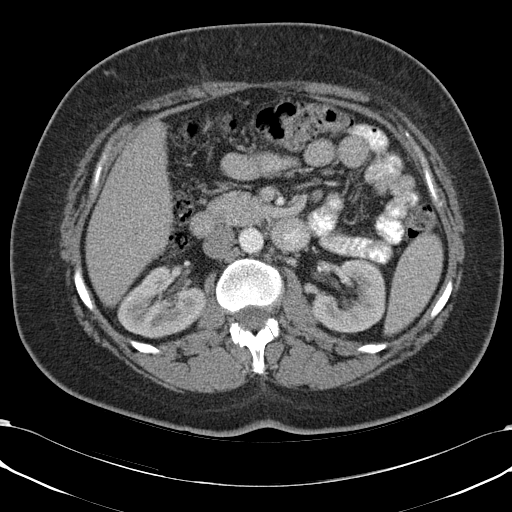
[im 31/59  soft-tissue]
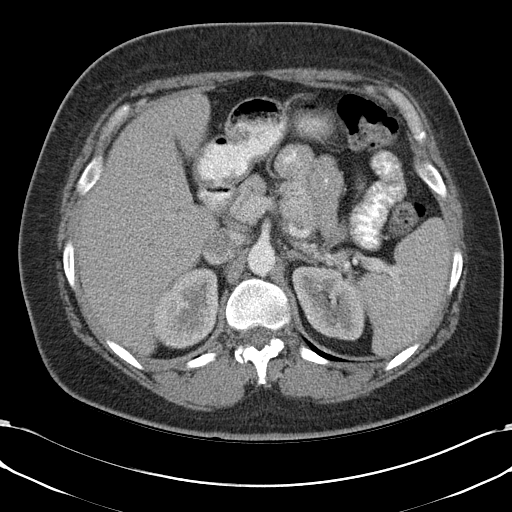
[im 35/59  soft-tissue]
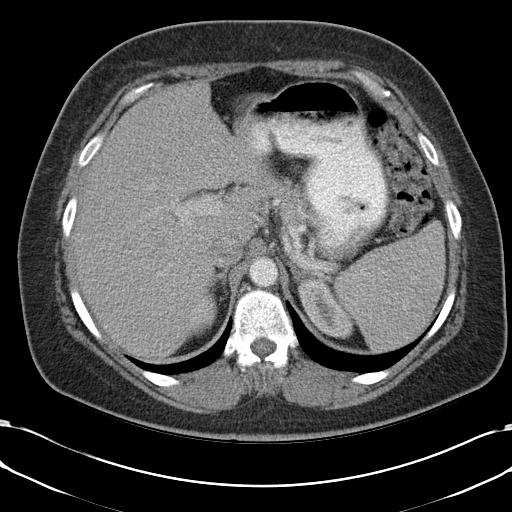
[im 43/59  soft-tissue]
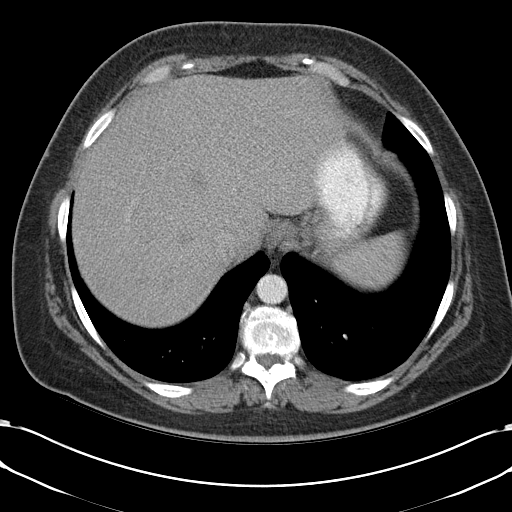
[im 43/59  lung]
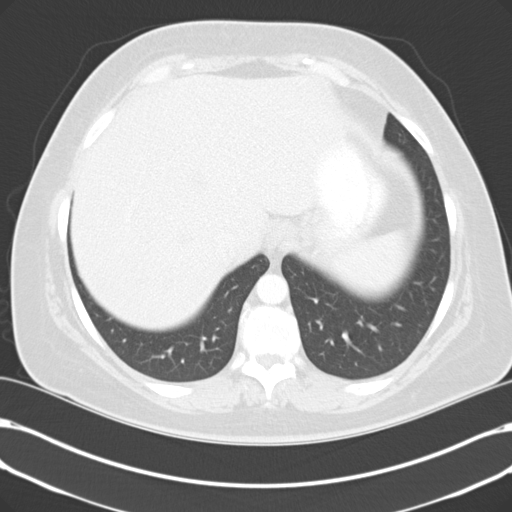
[im 47/59  soft-tissue]
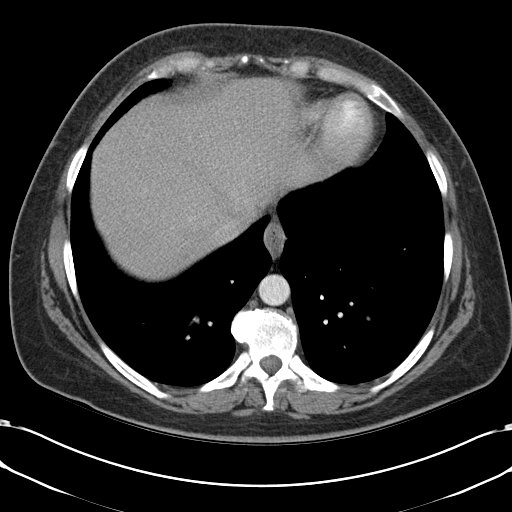
[im 47/59  lung]
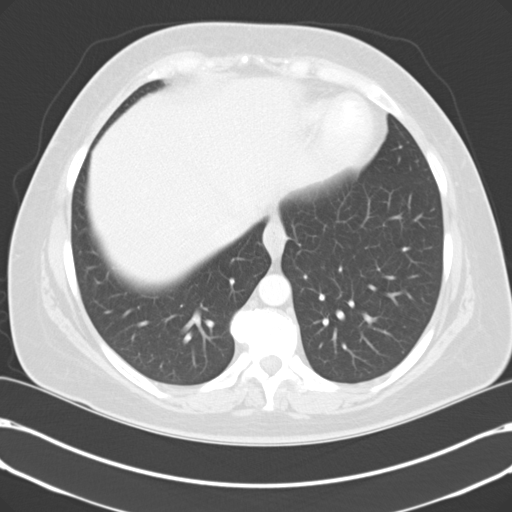
[im 51/59  lung]
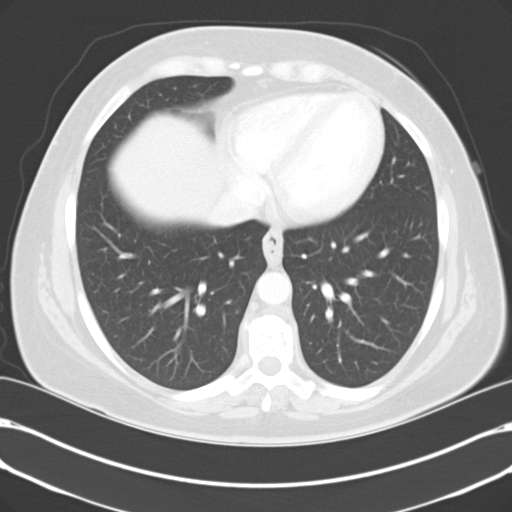
[im 55/59  soft-tissue]
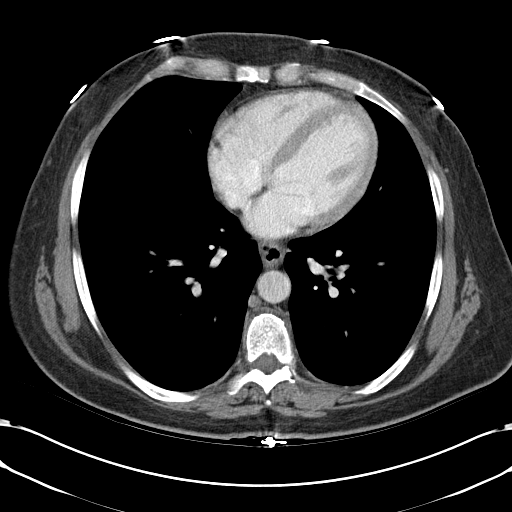
[im 55/59  lung]
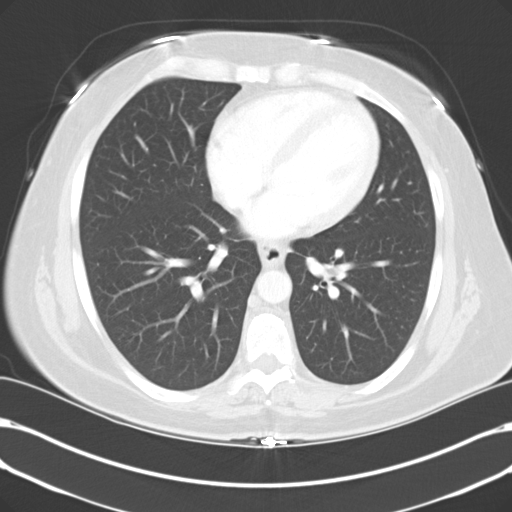
[im 55/59  bone]
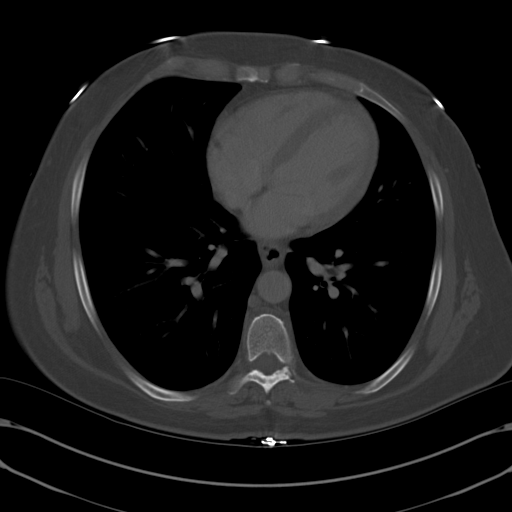

[Series 3: abd_pel_with 3.0 spo cor · coronal · 0.59mm/px · 3 of 92 slices shown]
[im 23/92  soft-tissue]
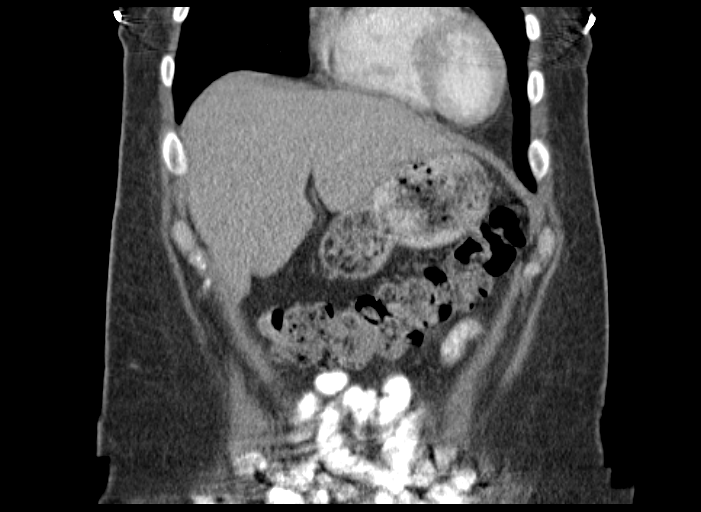
[im 46/92  soft-tissue]
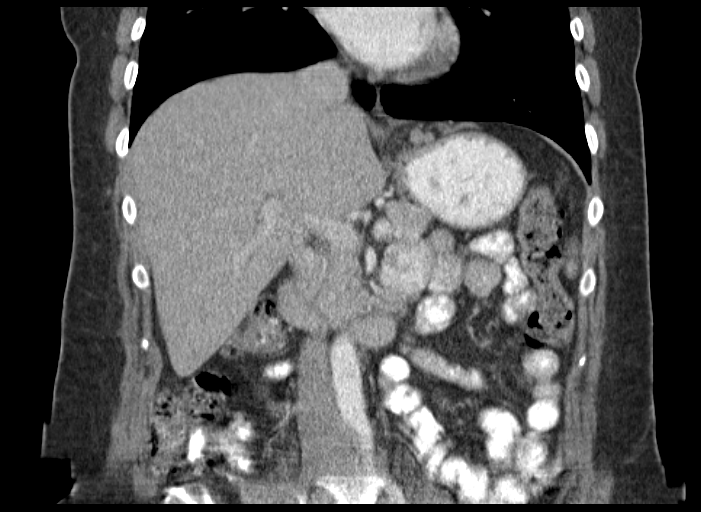
[im 69/92  soft-tissue]
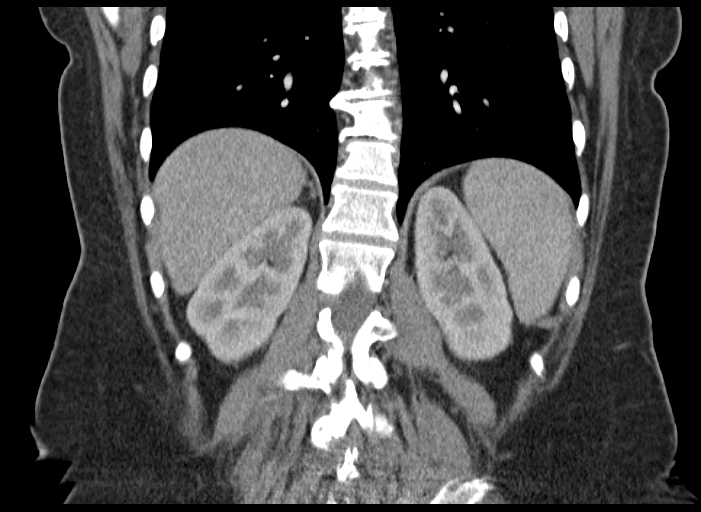

[13 of 46 positions shown; findings below may reference images not displayed]

FINDINGS: Lower chest: Visualized heart is normal in size. No consolidative or
nodular pulmonary opacities. No pleural effusion.

Hepatobiliary: Liver is normal in size and contour. Sub cm
low-attenuation lesion posterior right hepatic lobe (image 33;
series 2), too small to characterize. The patient is status post
cholecystectomy. No intrahepatic or extrahepatic biliary ductal
dilatation. There is an additional sub cm too small to characterize
low-attenuation lesion within the left hepatic lobe (image 21;
series 2).

Pancreas: Unremarkable

Spleen: Unremarkable

Adrenals/Urinary Tract: Normal adrenal glands. Kidneys enhance
symmetrically with contrast. No hydronephrosis. Possible 2 mm
nonobstructing stone inferior pole right kidney (image 62; series
3).

Stomach/Bowel: There is oral contrast demonstrated within the
visualized large and small bowel without evidence for obstruction.
No free fluid or free intraperitoneal air.

Vascular/Lymphatic: Normal caliber abdominal aorta. No
retroperitoneal lymphadenopathy.

Other: None

Musculoskeletal: No aggressive or acute appearing osseous lesions.
IMPRESSION: No acute process visualized within the abdomen.

The appendix is not visualized as the pelvis is not included on
current examination.

Possible nonobstructing 2 mm stone inferior pole right kidney.
